# Patient Record
Sex: Male | Born: 1970
Health system: Southern US, Community
[De-identification: ages and names within clinical notes are randomized; demographics above are authoritative.]

## PROBLEM LIST (undated history)

## (undated) DIAGNOSIS — G4730 Sleep apnea, unspecified: Secondary | ICD-10-CM

## (undated) DIAGNOSIS — R569 Unspecified convulsions: Secondary | ICD-10-CM

## (undated) DIAGNOSIS — E119 Type 2 diabetes mellitus without complications: Secondary | ICD-10-CM

## (undated) DIAGNOSIS — I10 Essential (primary) hypertension: Secondary | ICD-10-CM

## (undated) DIAGNOSIS — M549 Dorsalgia, unspecified: Secondary | ICD-10-CM

## (undated) HISTORY — DX: Unspecified convulsions: R56.9

## (undated) HISTORY — PX: OTHER SURGICAL HISTORY: SHX169

## (undated) HISTORY — DX: Dorsalgia, unspecified: M54.9

## (undated) HISTORY — PX: CIRCUMCISION: SUR203

## (undated) HISTORY — DX: Essential (primary) hypertension: I10

## (undated) HISTORY — DX: Type 2 diabetes mellitus without complications: E11.9

## (undated) HISTORY — DX: Sleep apnea, unspecified: G47.30

---

## 1998-11-28 ENCOUNTER — Encounter: Admission: RE | Admit: 1998-11-28 | Discharge: 1998-11-28 | Payer: Self-pay | Admitting: *Deleted

## 2003-09-07 ENCOUNTER — Encounter: Admission: RE | Admit: 2003-09-07 | Discharge: 2003-09-07 | Payer: Self-pay | Admitting: Occupational Medicine

## 2005-01-09 ENCOUNTER — Encounter: Admission: RE | Admit: 2005-01-09 | Discharge: 2005-04-09 | Payer: Self-pay | Admitting: Family Medicine

## 2005-03-12 ENCOUNTER — Ambulatory Visit (HOSPITAL_COMMUNITY): Admission: RE | Admit: 2005-03-12 | Discharge: 2005-03-12 | Payer: Self-pay | Admitting: Gastroenterology

## 2010-12-05 ENCOUNTER — Encounter
Admission: RE | Admit: 2010-12-05 | Discharge: 2010-12-05 | Payer: Self-pay | Source: Home / Self Care | Attending: Family Medicine | Admitting: Family Medicine

## 2010-12-14 ENCOUNTER — Ambulatory Visit (HOSPITAL_COMMUNITY)
Admission: RE | Admit: 2010-12-14 | Discharge: 2010-12-14 | Disposition: A | Discharge status: Home / Self Care | Payer: Managed Care, Other (non HMO) | Source: Ambulatory Visit | Attending: Urology | Admitting: Urology

## 2010-12-14 ENCOUNTER — Ambulatory Visit (HOSPITAL_COMMUNITY): Payer: Managed Care, Other (non HMO)

## 2010-12-14 DIAGNOSIS — E669 Obesity, unspecified: Secondary | ICD-10-CM | POA: Insufficient documentation

## 2010-12-14 DIAGNOSIS — E119 Type 2 diabetes mellitus without complications: Secondary | ICD-10-CM | POA: Insufficient documentation

## 2010-12-14 DIAGNOSIS — N201 Calculus of ureter: Secondary | ICD-10-CM | POA: Insufficient documentation

## 2010-12-14 LAB — GLUCOSE, CAPILLARY
Glucose-Capillary: 94 mg/dL (ref 70–99)
Glucose-Capillary: 159 mg/dL — ABNORMAL HIGH (ref 70–99)

## 2011-02-19 ENCOUNTER — Other Ambulatory Visit: Payer: Self-pay | Admitting: Urology

## 2011-02-19 ENCOUNTER — Encounter (HOSPITAL_COMMUNITY): Payer: Managed Care, Other (non HMO) | Attending: Urology

## 2011-02-19 DIAGNOSIS — N201 Calculus of ureter: Secondary | ICD-10-CM | POA: Insufficient documentation

## 2011-02-19 DIAGNOSIS — Z79899 Other long term (current) drug therapy: Secondary | ICD-10-CM | POA: Insufficient documentation

## 2011-02-19 DIAGNOSIS — E119 Type 2 diabetes mellitus without complications: Secondary | ICD-10-CM | POA: Insufficient documentation

## 2011-02-19 DIAGNOSIS — Z01812 Encounter for preprocedural laboratory examination: Secondary | ICD-10-CM | POA: Insufficient documentation

## 2011-02-19 DIAGNOSIS — Z01810 Encounter for preprocedural cardiovascular examination: Secondary | ICD-10-CM | POA: Insufficient documentation

## 2011-02-19 DIAGNOSIS — E785 Hyperlipidemia, unspecified: Secondary | ICD-10-CM | POA: Insufficient documentation

## 2011-02-19 LAB — BASIC METABOLIC PANEL
BUN: 10 mg/dL (ref 6–23)
CO2: 31 mEq/L (ref 19–32)
Calcium: 9.7 mg/dL (ref 8.4–10.5)
Chloride: 104 mEq/L (ref 96–112)
Creatinine, Ser: 0.73 mg/dL (ref 0.4–1.5)
GFR calc Af Amer: >60 mL/min (ref >60)
GFR calc non Af Amer: >60 mL/min (ref >60)
Glucose, Bld: 104 mg/dL — ABNORMAL HIGH (ref 70–99)
Potassium: 4.9 mEq/L (ref 3.5–5.1)
Sodium: 141 mEq/L (ref 135–145)

## 2011-02-19 LAB — CBC
HCT: 45.6 % (ref 39.0–52.0)
Hemoglobin: 15.5 g/dL (ref 13.0–17.0)
MCH: 27.1 pg (ref 26.0–34.0)
MCHC: 34 g/dL (ref 30.0–36.0)
MCV: 79.6 fL (ref 78.0–100.0)
Platelets: 297 10*3/uL (ref 150–400)
RBC: 5.73 MIL/uL (ref 4.22–5.81)
RDW: 13.1 % (ref 11.5–15.5)
WBC: 8.5 10*3/uL (ref 4.0–10.5)

## 2011-02-19 LAB — SURGICAL PCR SCREEN
MRSA, PCR: NEGATIVE
Staphylococcus aureus: NEGATIVE

## 2011-02-23 ENCOUNTER — Ambulatory Visit (HOSPITAL_COMMUNITY)
Admission: RE | Admit: 2011-02-23 | Discharge: 2011-02-23 | Disposition: A | Discharge status: Home / Self Care | Payer: Managed Care, Other (non HMO) | Source: Ambulatory Visit | Attending: Urology | Admitting: Urology

## 2011-02-23 DIAGNOSIS — I10 Essential (primary) hypertension: Secondary | ICD-10-CM | POA: Insufficient documentation

## 2011-02-23 DIAGNOSIS — E119 Type 2 diabetes mellitus without complications: Secondary | ICD-10-CM | POA: Insufficient documentation

## 2011-02-23 DIAGNOSIS — Z79899 Other long term (current) drug therapy: Secondary | ICD-10-CM | POA: Insufficient documentation

## 2011-02-23 DIAGNOSIS — E785 Hyperlipidemia, unspecified: Secondary | ICD-10-CM | POA: Insufficient documentation

## 2011-02-23 DIAGNOSIS — N201 Calculus of ureter: Secondary | ICD-10-CM | POA: Insufficient documentation

## 2011-02-23 LAB — GLUCOSE, CAPILLARY: Glucose-Capillary: 106 mg/dL — ABNORMAL HIGH (ref 70–99)

## 2011-02-26 LAB — GLUCOSE, CAPILLARY: Glucose-Capillary: 89 mg/dL (ref 70–99)

## 2011-03-05 NOTE — Op Note (Signed)
[name], [name]                 ACCOUNT NO.:  [id number]  MEDICAL RECORD NO.:  [id number]           PATIENT TYPE:  O  LOCATION:  DAYL                         FACILITY:  [facility]  PHYSICIAN:  Heloise Purpura, MD      DATE OF BIRTH:  July 14, 1971  DATE OF PROCEDURE:  02/23/2011 DATE OF DISCHARGE:  02/23/2011                              OPERATIVE REPORT   PREOPERATIVE DIAGNOSIS:  Right ureteral calculus.  POSTOPERATIVE DIAGNOSIS:  Right ureteral calculus.  PROCEDURES: 1. Cystoscopy. 2. Right retrograde pyelography. 3. Right ureteroscopic laser lithotripsy and stone removal. 4. Right ureteral stent placement (6 x 24).  SURGEON:  Heloise Purpura, MD  ANESTHESIA:  General.  COMPLICATIONS:  None.  ESTIMATED BLOOD LOSS:  Minimal.  INDICATIONS:  [name] is a 40 year old gentleman with a 4 mm to 5 mm proximal right ureteral calculus.  He had undergone a shock wave lithotripsy but had a persistent stone and was unable to pass the stone with conservative measures.  We therefore discussed options and I recommended proceeding with ureteroscopic laser lithotripsy.  The potential risks and complications associated with the above procedures were discussed in detail and informed consent was obtained.  DESCRIPTION OF PROCEDURE:  The patient was taken to the operating room and a general anesthetic was administered.  He was given preoperative antibiotics, placed in the dorsal lithotomy position, and prepped and draped in the usual sterile fashion.  Next, a preoperative time-out was performed.  Cystourethroscopy was then performed which revealed a normal anterior and posterior urethra.  Inspection of the bladder revealed no evidence of any bladder tumors, stones, or other mucosal pathology.  The ureteral orifices were in their expected anatomic position.  The right ureteral orifice was then identified and intubated with a 6-French ureteral catheter.  Omnipaque contrast was injected with a  filling defect seen in the proximal ureter consistent with the patient's known stone.  A 0.038 sensor guidewire was then advanced up into the right renal pelvis under fluoroscopic guidance.  A 12/14 ACMI Gyrus ureteral access sheath was then advanced over the wire up into the proximal ureter well below the level of the stone.  Of note, the entire distal ureter appeared normal on retrograde pyelography without other filling defects or abnormalities.  The digital flexible ureteroscope was then advanced through the ureteral access sheath and the proximal ureter was examined.  No calculus was seen in the proximal ureter.  The patient's stone was eventually found up in the renal pelvis and had migrated proximally.  He was able to be grasped with a 0 tip nitinol basket brought into the proximal ureter.  Backstop gel was then injected above the level of the stone and laser lithotripsy was performed with a 200- micron holmium laser fiber at a setting of 0.6 joules and 6 Hz.  The stone was fragmented into 2 pieces and each of these fragments was then removed with a 0 tip nitinol basket.  The remainder of the renal pelvis was then examined and no remaining stone fragments or other calculi were identified.  A 0.038 sensor guidewire was then advanced through the ureteroscope and  the ureteroscope and ureteral access sheath were withdrawn together with the remainder of the ureter examined under direct vision and no further abnormalities or stones identified.  The wire was then back loaded on the rigid cystoscope and a 6 x 24 double-J ureteral stent was advanced over the wire using Seldinger technique.  It was positioned appropriately under fluoroscopic and cystoscopic guidance.  The wire was then removed with good curl noted in the renal pelvis as well as in the bladder.  The patient tolerated the procedure well without complications.  He was able to be awakened and transferred to recovery unit in  satisfactory condition.     Heloise Purpura, MD     LB/MEDQ  D:  02/23/2011  T:  02/23/2011  Job:  [id number]  Electronically Signed by Heloise Purpura MD on 03/05/2011 05:29:26 PM

## 2012-01-19 IMAGING — CR DG ABDOMEN 1V
2 series · 2 of 2 positions shown · non-contrast
Comparison: CT abdomen and pelvis 12/05/2010.

CLINICAL DATA: Patient for lithotripsy.

ABDOMEN - 1 VIEW

[t abdomen supine *]
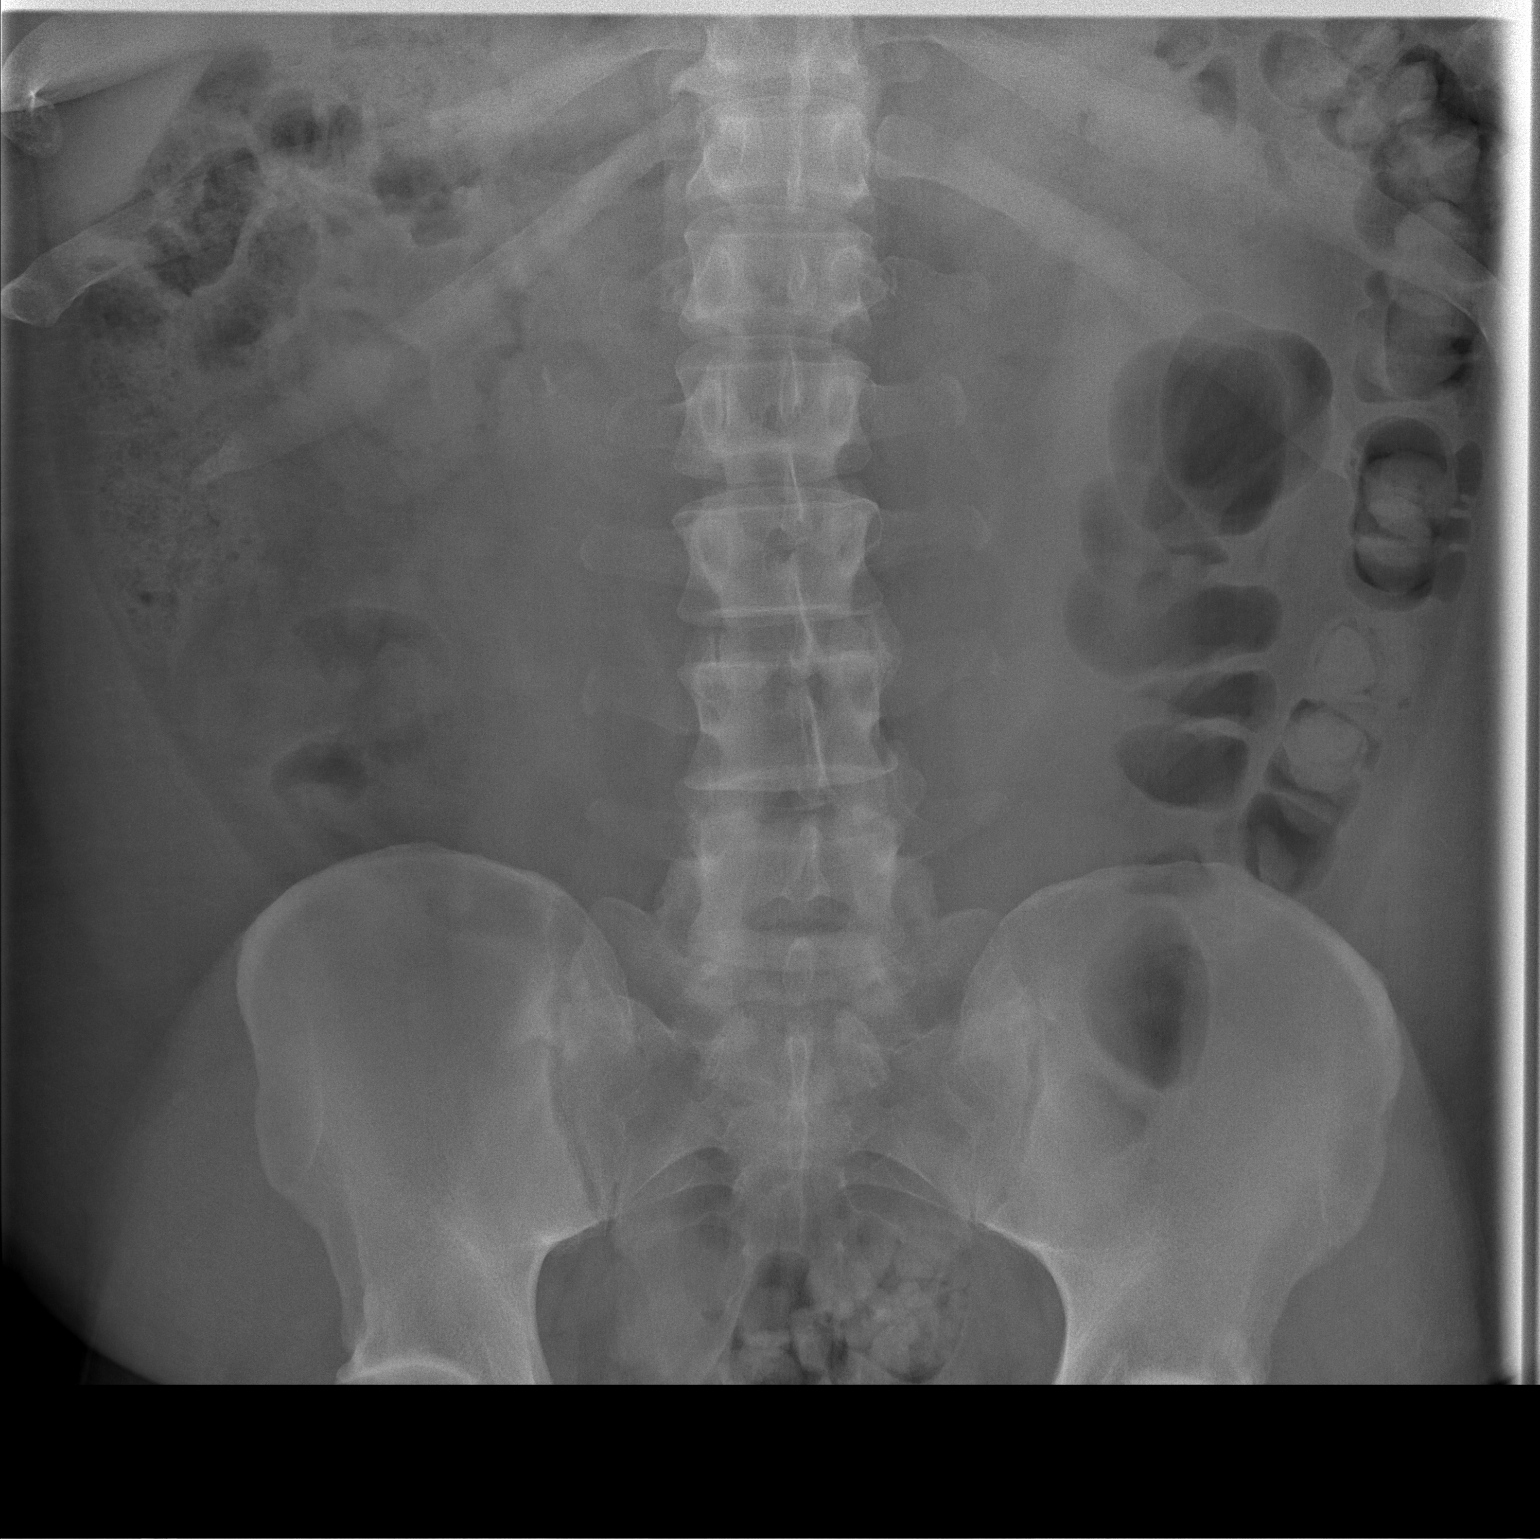

[t abdomen supine]
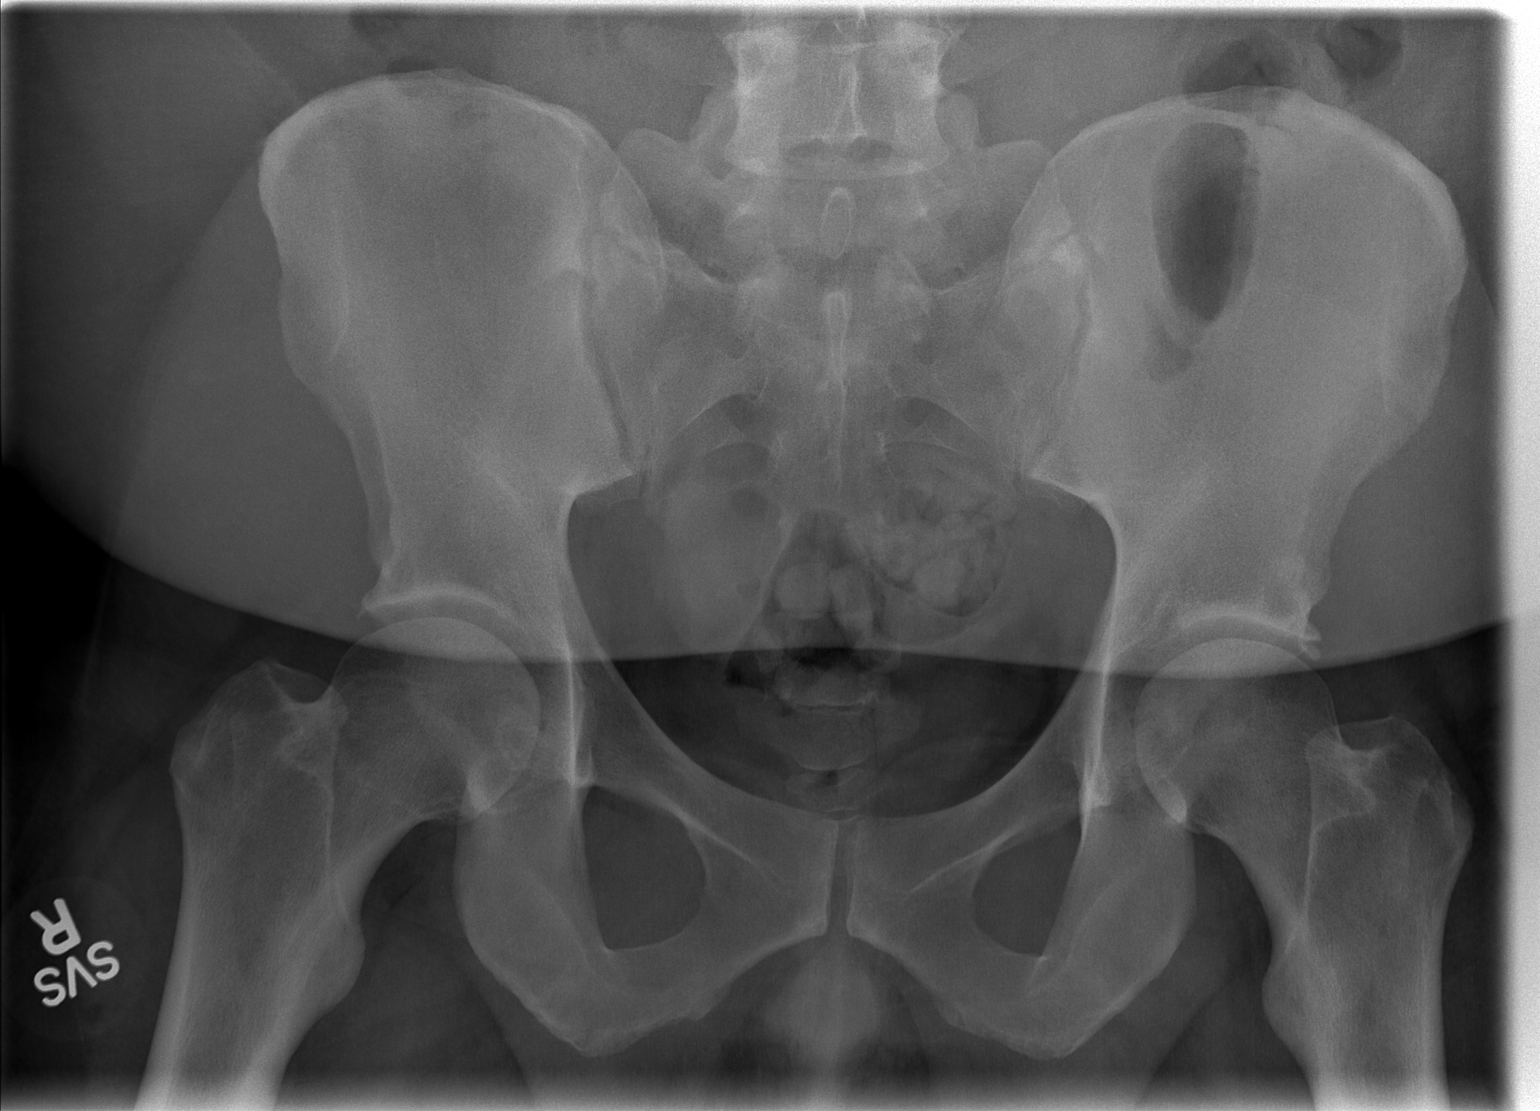

[2 of 2 positions shown; findings below may reference images not displayed]

FINDINGS: A 1.0 cm calcification is seen in expected course the
right ureter just above the level of the L2 transverse process.  No
other unexpected calcifications.  Bowel gas pattern normal.
IMPRESSION: Proximal right ureteral calculus.

## 2013-07-01 ENCOUNTER — Ambulatory Visit (INDEPENDENT_AMBULATORY_CARE_PROVIDER_SITE_OTHER): Payer: BC Managed Care – PPO | Admitting: General Surgery

## 2013-07-01 ENCOUNTER — Encounter (INDEPENDENT_AMBULATORY_CARE_PROVIDER_SITE_OTHER): Payer: Self-pay | Admitting: General Surgery

## 2013-07-01 DIAGNOSIS — I10 Essential (primary) hypertension: Secondary | ICD-10-CM

## 2013-07-01 DIAGNOSIS — E119 Type 2 diabetes mellitus without complications: Secondary | ICD-10-CM

## 2013-07-01 NOTE — Progress Notes (Signed)
Subjective:   Morbid obesity  Patient ID: [name], male   DOB: Oct 13, 1971, 42 y.o.   MRN: [id number]  HPI [name] y.o.male Referred by Dr. Clarene Duke for consideration for surgical treatment for morbid obesity.  he  gives a history of progressive obesity since childhood despite multiple attempts at medical management. He has been able to lose a large amount of weight on a number of occasions but then experiences inevitable weight regain. his weight has been affecting him in a number of ways including the onset of diabetes mellitus and hypertension. He also has significant knee and back pain.  He is very concerned about his long-term health lifespan going forward if he is not able to get his weight down..   he has been to our initial information seminar, researched surgical options thoroughly and is interested in Gastric bypass redo to the specific effect on diabetes.  Past Medical History  Diagnosis Date  . Hypertension   . Diabetes mellitus without complication   . Seizures    Past Surgical History  Procedure Laterality Date  . Kidney stone removal    . Circumcision     Current Outpatient Prescriptions  Medication Sig Dispense Refill  . atorvastatin (LIPITOR) 80 MG tablet       . DILANTIN 100 MG ER capsule       . Levetiracetam 750 MG TB24       . losartan (COZAAR) 25 MG tablet       . metFORMIN (GLUCOPHAGE) 1000 MG tablet       . nabumetone (RELAFEN) 500 MG tablet       . niacin (NIASPAN) 500 MG CR tablet       . pioglitazone (ACTOS) 45 MG tablet        No current facility-administered medications for this visit.   Allergies  Allergen Reactions  . Tramadol     seizures   History  Substance Use Topics  . Smoking status: Never Smoker   . Smokeless tobacco: Not on file  . Alcohol Use: No  He is married. His wife is a Engineer, civil (consulting) who works for [employer] and supports his decision to seek surgery    Review of Systems  Constitutional: Negative.   HENT: Negative.   Eyes:  Negative.   Respiratory: Negative.   Cardiovascular: Negative.   Gastrointestinal: Negative.   Genitourinary: Negative.   Musculoskeletal: Positive for back pain and arthralgias. Negative for joint swelling and gait problem.  Neurological: Positive for seizures. Negative for syncope, weakness and numbness.  Psychiatric/Behavioral: Negative.        Objective:   Physical Exam BP 122/68  Pulse 84  Temp(Src) 96.6 F (35.9 C) (Temporal)  Resp 16  Ht 5' 10.25" (1.784 m)  Wt 289 lb 3.2 oz (131.18 kg)  BMI 41.22 kg/m2  SpO2 97% General: Alert, Morbidly obese African American male, in no distress Skin: Warm and dry without rash or infection. HEENT: No palpable masses or thyromegaly. Sclera nonicteric. Pupils equal round and reactive. Oropharynx clear. Lymph nodes: No cervical, supraclavicular, or inguinal nodes palpable. Lungs: Breath sounds clear and equal without increased work of breathing Cardiovascular: Regular rate and rhythm without murmur. No JVD or edema. Peripheral pulses intact. Abdomen: Nondistended. Soft and nontender. No masses palpable. No organomegaly. No palpable hernias. Extremities: No edema or joint swelling or deformity. No chronic venous stasis changes. Neurologic: Alert and fully oriented. Gait normal.    Assessment:     Patient with progressive morbid obesity unresponsive to  multiple efforts at medical management who presents with a BMI of 41 and comorbidities of Adult-onset diabetes mellitus, hypertension and joint pain/arthritis.. I believe there would be very significant medical benefit from surgical weight loss. After our discussion of surgical options currently available the patient has decided to proceed with laparoscopic Roux-en-Y gastric bypass due to the reasons above. We have discussed the nature of the problem and the risks of remaining morbidly obese. We discussed laparoscopic Roux-en-Y gastric bypass in detail including the nature of the procedure,  expected hospitalization and recovery, and major risks of anesthetic complications, bleeding, blood clots and pulmonary emboli, leakage and infection and long-term risks of stricture, ulceration, bowel obstruction, nutritional deficiencies, and failure to lose weight or weight regain. We discussed these problems could lead to death. The patient was given a complete consent form to review and all questions were answered. We will go ahead with preoperative including psychological and nutrition evaluations, H. pylori testing, ultrasound, bone density, and routine lab and x-rays. I will see the patient back following these studies.    Plan:     As above

## 2013-07-01 NOTE — Addendum Note (Signed)
Addended by: Maryan Puls on: 07/01/2013 05:05 PM   Modules accepted: Orders

## 2013-07-07 ENCOUNTER — Ambulatory Visit (HOSPITAL_COMMUNITY)
Admission: RE | Admit: 2013-07-07 | Discharge: 2013-07-07 | Disposition: A | Discharge status: Home / Self Care | Payer: BC Managed Care – PPO | Source: Ambulatory Visit | Attending: General Surgery | Admitting: General Surgery

## 2013-07-07 DIAGNOSIS — Z6841 Body Mass Index (BMI) 40.0 and over, adult: Secondary | ICD-10-CM | POA: Insufficient documentation

## 2013-07-07 DIAGNOSIS — I10 Essential (primary) hypertension: Secondary | ICD-10-CM

## 2013-07-07 DIAGNOSIS — Q619 Cystic kidney disease, unspecified: Secondary | ICD-10-CM | POA: Insufficient documentation

## 2013-07-07 DIAGNOSIS — E119 Type 2 diabetes mellitus without complications: Secondary | ICD-10-CM

## 2013-07-07 DIAGNOSIS — Z13820 Encounter for screening for osteoporosis: Secondary | ICD-10-CM | POA: Insufficient documentation

## 2013-07-07 DIAGNOSIS — M129 Arthropathy, unspecified: Secondary | ICD-10-CM | POA: Insufficient documentation

## 2013-07-07 DIAGNOSIS — I517 Cardiomegaly: Secondary | ICD-10-CM | POA: Insufficient documentation

## 2013-07-07 LAB — CBC WITH DIFFERENTIAL/PLATELET
Basophils Absolute: 0 10*3/uL (ref 0.0–0.1)
Basophils Relative: 1 % (ref 0–1)
Eosinophils Absolute: 0.2 10*3/uL (ref 0.0–0.7)
Eosinophils Relative: 3 % (ref 0–5)
HCT: 44 % (ref 39.0–52.0)
Hemoglobin: 15 g/dL (ref 13.0–17.0)
Lymphocytes Relative: 42 % (ref 12–46)
Lymphs Abs: 2.4 10*3/uL (ref 0.7–4.0)
MCH: 28 pg (ref 26.0–34.0)
MCHC: 34.1 g/dL (ref 30.0–36.0)
MCV: 82.2 fL (ref 78.0–100.0)
Monocytes Absolute: 0.4 10*3/uL (ref 0.1–1.0)
Monocytes Relative: 7 % (ref 3–12)
Neutro Abs: 2.6 10*3/uL (ref 1.7–7.7)
Neutrophils Relative %: 47 % (ref 43–77)
Platelets: 236 10*3/uL (ref 150–400)
RBC: 5.35 MIL/uL (ref 4.22–5.81)
RDW: 13.9 % (ref 11.5–15.5)
WBC: 5.7 10*3/uL (ref 4.0–10.5)

## 2013-07-07 LAB — HEMOGLOBIN A1C
Hgb A1c MFr Bld: 5.3 % (ref <5.7)
Mean Plasma Glucose: 105 mg/dL (ref <117)

## 2013-07-08 ENCOUNTER — Ambulatory Visit (HOSPITAL_COMMUNITY): Payer: BC Managed Care – PPO

## 2013-07-08 ENCOUNTER — Encounter (HOSPITAL_COMMUNITY)
Admission: RE | Disposition: A | Discharge status: Home / Self Care | Payer: Self-pay | Source: Ambulatory Visit | Attending: General Surgery

## 2013-07-08 ENCOUNTER — Ambulatory Visit (HOSPITAL_COMMUNITY)
Admission: RE | Admit: 2013-07-08 | Discharge: 2013-07-08 | Disposition: A | Discharge status: Home / Self Care | Payer: BC Managed Care – PPO | Source: Ambulatory Visit | Attending: General Surgery | Admitting: General Surgery

## 2013-07-08 HISTORY — PX: BREATH TEK H PYLORI: SHX5422

## 2013-07-08 LAB — COMPREHENSIVE METABOLIC PANEL
ALT: 49 U/L (ref 0–53)
AST: 24 U/L (ref 0–37)
Albumin: 4.1 g/dL (ref 3.5–5.2)
Alkaline Phosphatase: 64 U/L (ref 39–117)
BUN: 13 mg/dL (ref 6–23)
CO2: 32 mEq/L (ref 19–32)
Calcium: 9.2 mg/dL (ref 8.4–10.5)
Chloride: 103 mEq/L (ref 96–112)
Creat: 0.87 mg/dL (ref 0.50–1.35)
Glucose, Bld: 90 mg/dL (ref 70–99)
Potassium: 4.4 mEq/L (ref 3.5–5.3)
Sodium: 141 mEq/L (ref 135–145)
Total Bilirubin: 0.4 mg/dL (ref 0.3–1.2)
Total Protein: 6.7 g/dL (ref 6.0–8.3)

## 2013-07-08 LAB — LIPID PANEL
Cholesterol: 128 mg/dL (ref 0–200)
HDL: 51 mg/dL (ref >39)
LDL Cholesterol: 67 mg/dL (ref 0–99)
Total CHOL/HDL Ratio: 2.5 Ratio
Triglycerides: 49 mg/dL (ref <150)
VLDL: 10 mg/dL (ref 0–40)

## 2013-07-08 LAB — TSH: TSH: 5.201 u[IU]/mL — ABNORMAL HIGH (ref 0.350–4.500)

## 2013-07-08 LAB — T4: T4, Total: 6 ug/dL (ref 5.0–12.5)

## 2013-07-08 SURGERY — BREATH TEST, FOR HELICOBACTER PYLORI

## 2013-07-09 ENCOUNTER — Encounter (HOSPITAL_COMMUNITY): Payer: Self-pay | Admitting: General Surgery

## 2013-07-23 ENCOUNTER — Encounter: Payer: Self-pay | Admitting: Dietician

## 2013-07-23 ENCOUNTER — Encounter: Payer: BC Managed Care – PPO | Attending: General Surgery | Admitting: Dietician

## 2013-07-23 DIAGNOSIS — Z713 Dietary counseling and surveillance: Secondary | ICD-10-CM | POA: Insufficient documentation

## 2013-07-23 NOTE — Progress Notes (Signed)
  Pre-Op Assessment Visit:  Pre-Operative RYGB Surgery  Medical Nutrition Therapy:  Appt start time: 1000   End time:  1100.  Patient was seen on 07/23/2013 for Pre-Operative RYGB Nutrition Assessment. Assessment and letter of approval faxed to [facility] Bariatric Surgery Program coordinator on 07/23/2013.   Handouts given during visit include:  Pre-Op Goals Bariatric Surgery Protein Shakes  Patient to call the [facility] to enroll in Pre-Op and Post-Op Nutrition Education when surgery date is scheduled.

## 2013-07-23 NOTE — Patient Instructions (Addendum)
Patient to call the [facility] to enroll in Pre-Op and Post-Op Nutrition Education when surgery date is scheduled. 

## 2014-03-01 ENCOUNTER — Other Ambulatory Visit: Payer: Self-pay | Admitting: Family Medicine

## 2014-03-01 DIAGNOSIS — M545 Low back pain, unspecified: Secondary | ICD-10-CM

## 2014-03-04 ENCOUNTER — Ambulatory Visit
Admission: RE | Admit: 2014-03-04 | Discharge: 2014-03-04 | Disposition: A | Discharge status: Home / Self Care | Payer: 59 | Source: Ambulatory Visit | Attending: Family Medicine | Admitting: Family Medicine

## 2014-03-04 DIAGNOSIS — M545 Low back pain, unspecified: Secondary | ICD-10-CM

## 2014-08-12 IMAGING — US US ABDOMEN COMPLETE
1 series · 13 of 25 positions shown · non-contrast
Comparison: CT abdomen pelvis - 12/05/2010

CLINICAL DATA: Preoperative examination (weight loss surgery)

COMPLETE ABDOMINAL ULTRASOUND

[Series 1: us abdomen complete · 13 of 74 slices shown]
[im 1/74]
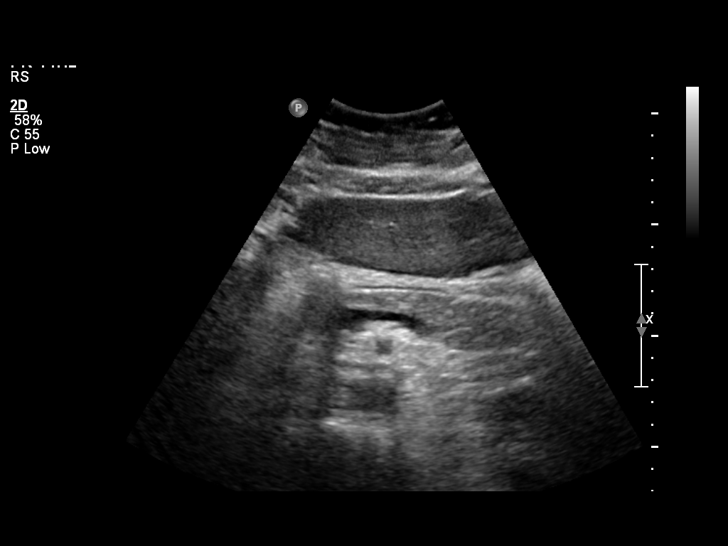
[im 7/74]
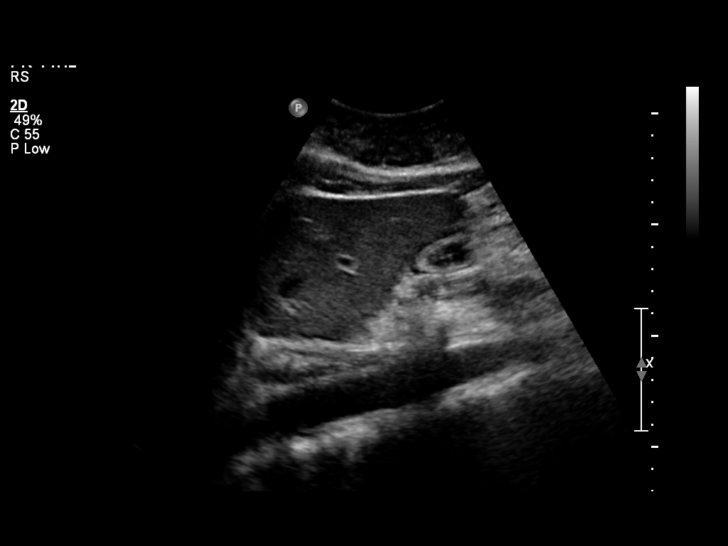
[im 13/74]
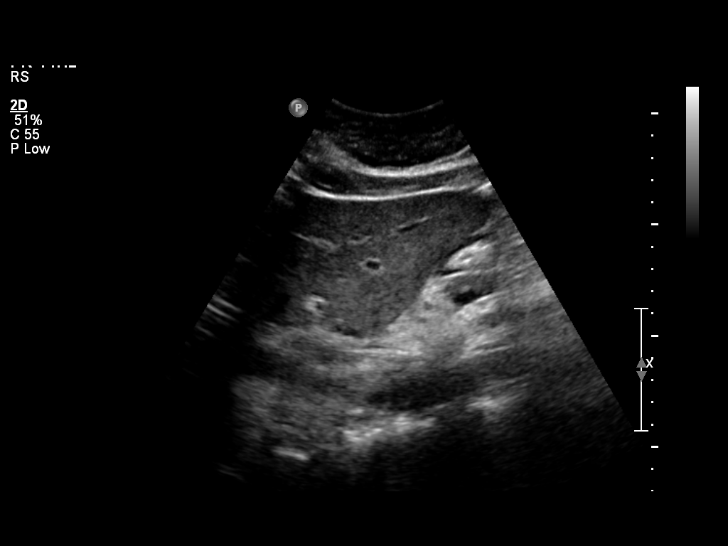
[im 19/74]
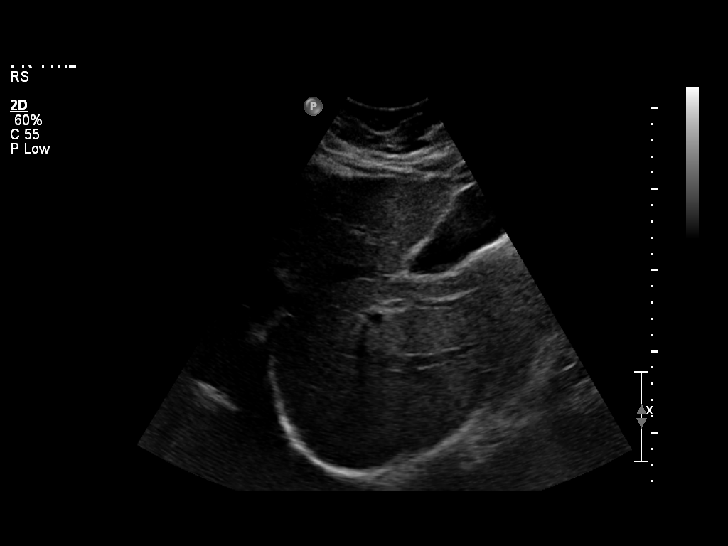
[im 25/74]
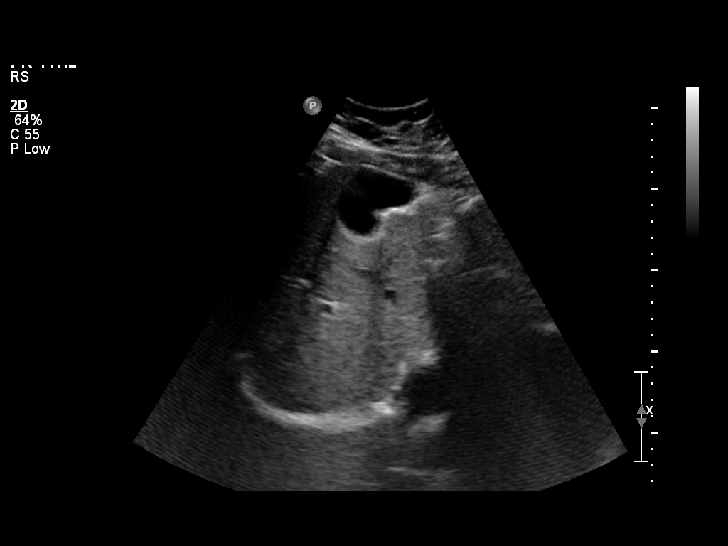
[im 31/74]
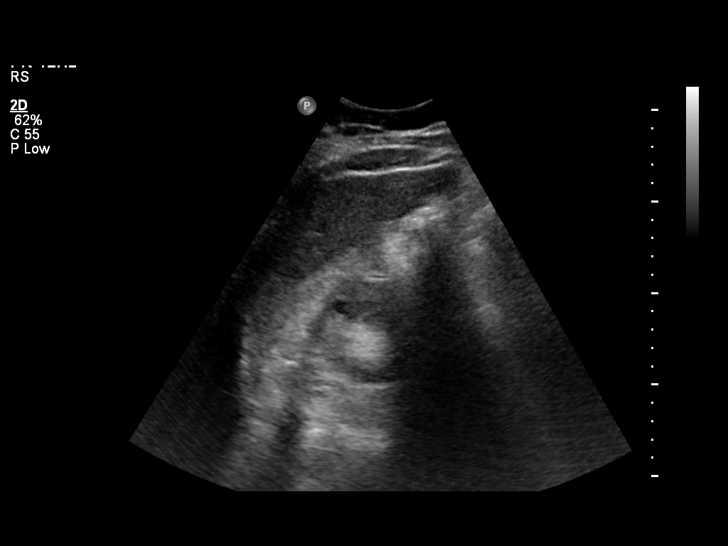
[im 37/74]
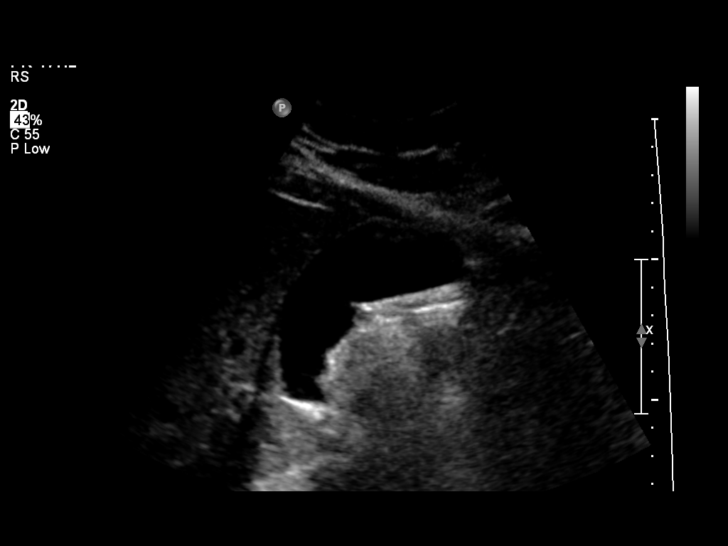
[im 43/74]
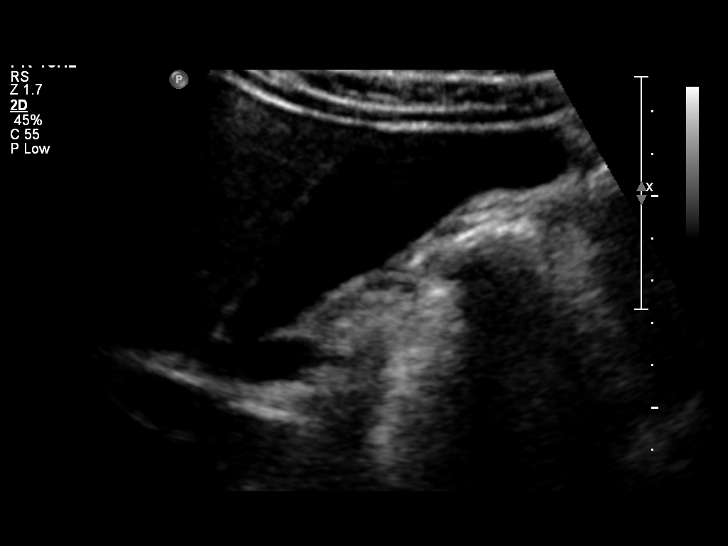
[im 49/74]
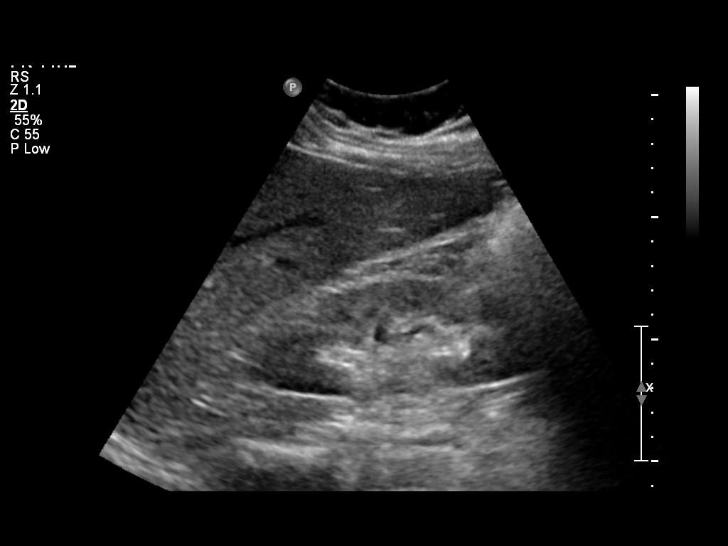
[im 55/74]
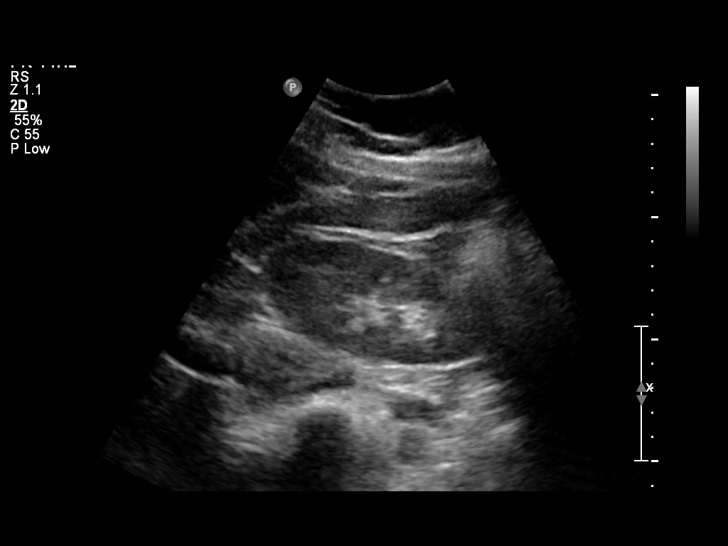
[im 61/74]
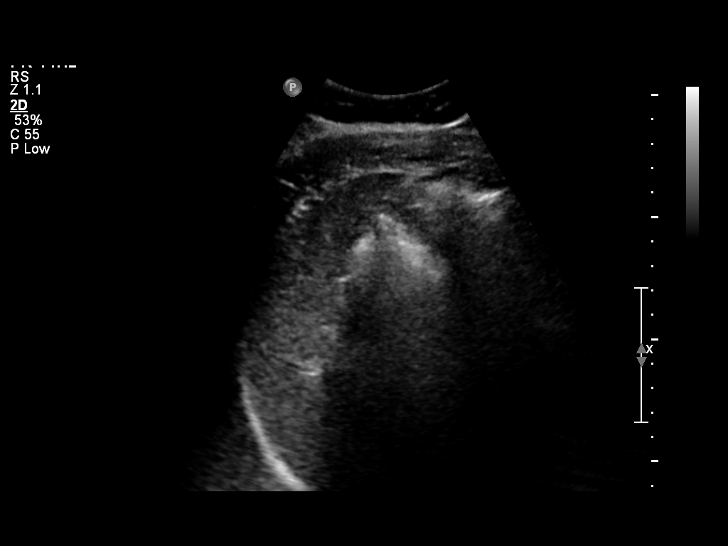
[im 67/74]
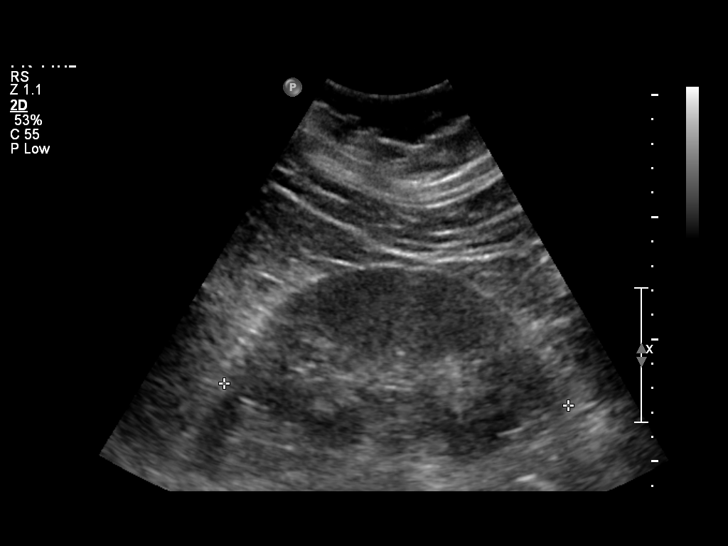
[im 74/74]
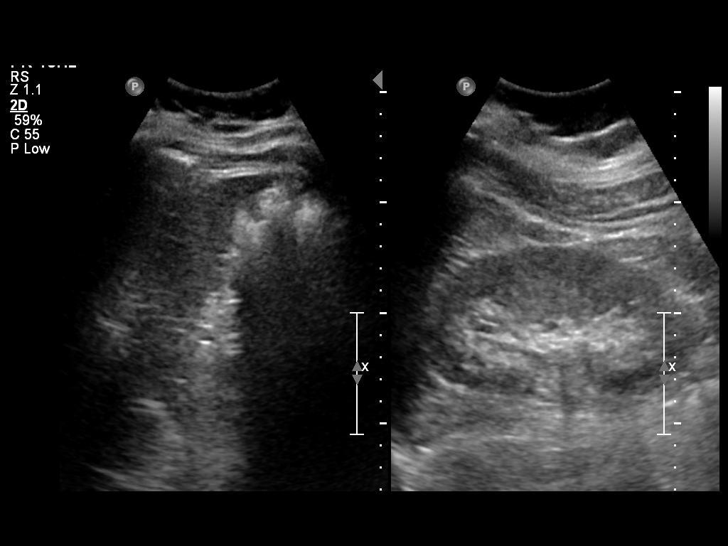

[13 of 25 positions shown; findings below may reference images not displayed]

FINDINGS: Gallbladder:  Sonographically normal.  No echogenic gallstones or
gall sludge.  No gallbladder wall thickening or pericholecystic
fluid.  Negative sonographic Murphy's sign.

Common bile duct:  Normal in size measuring 4 mm in diameter

Liver:  Homogeneous hepatic echotexture.  No discrete hepatic
lesions.  No definite evidence of intrahepatic biliary ductal
dilatation.  No ascites.

IVC:  Appears normal.

Pancreas:  Limited visualization of the pancreatic head and neck is
normal.  Visualization of the pancreatic body and tail is obscured
by bowel gas.

Spleen:  Normal in size measuring 5.5 cm in length

Right Kidney:  Normal cortical thickness, echogenicity and size,
measuring 13.6 cm in length.  Note is made of an approximately
x 1.2 x 1.1 cm anechoic cortical cyst within the superior pole of
the right kidney (images 58 and 59).  No echogenic renal stones.
No urinary obstruction.

Left Kidney:  Normal cortical thickness, echogenicity and size,
measuring 13.0 cm in length.  No focal renal lesions.  No echogenic
renal stones.  No urinary obstruction.

Abdominal aorta:  No aneurysm identified.
IMPRESSION: 1.  Negative abdominal ultrasound.

2.  Incidental note made of an approximately 1.4 cm right-sided
renal cyst.

## 2016-04-13 MED FILL — DILANTIN 100 MG CAPSULE: 100 | 30 days supply | Qty: 150 | Fill #0

## 2016-04-19 DIAGNOSIS — E118 Type 2 diabetes mellitus with unspecified complications: Secondary | ICD-10-CM | POA: Diagnosis not present

## 2016-04-19 DIAGNOSIS — E782 Mixed hyperlipidemia: Secondary | ICD-10-CM | POA: Diagnosis not present

## 2016-04-19 DIAGNOSIS — R809 Proteinuria, unspecified: Secondary | ICD-10-CM | POA: Diagnosis not present

## 2016-04-19 DIAGNOSIS — Z79899 Other long term (current) drug therapy: Secondary | ICD-10-CM | POA: Diagnosis not present

## 2016-04-19 DIAGNOSIS — Z7984 Long term (current) use of oral hypoglycemic drugs: Secondary | ICD-10-CM | POA: Diagnosis not present

## 2016-04-19 DIAGNOSIS — G40909 Epilepsy, unspecified, not intractable, without status epilepticus: Secondary | ICD-10-CM | POA: Diagnosis not present

## 2016-04-19 DIAGNOSIS — Z125 Encounter for screening for malignant neoplasm of prostate: Secondary | ICD-10-CM | POA: Diagnosis not present

## 2016-05-01 MED FILL — LEVETIRACETAM ER 750 MG TAB: 750 | 90 days supply | Qty: 360 | Fill #0 | Status: TO

## 2016-05-04 DIAGNOSIS — G40909 Epilepsy, unspecified, not intractable, without status epilepticus: Secondary | ICD-10-CM | POA: Diagnosis not present

## 2016-05-04 DIAGNOSIS — Z125 Encounter for screening for malignant neoplasm of prostate: Secondary | ICD-10-CM | POA: Diagnosis not present

## 2016-05-04 DIAGNOSIS — Z7984 Long term (current) use of oral hypoglycemic drugs: Secondary | ICD-10-CM | POA: Diagnosis not present

## 2016-05-04 DIAGNOSIS — E782 Mixed hyperlipidemia: Secondary | ICD-10-CM | POA: Diagnosis not present

## 2016-05-04 DIAGNOSIS — Z79899 Other long term (current) drug therapy: Secondary | ICD-10-CM | POA: Diagnosis not present

## 2016-05-04 DIAGNOSIS — R809 Proteinuria, unspecified: Secondary | ICD-10-CM | POA: Diagnosis not present

## 2016-05-04 DIAGNOSIS — E118 Type 2 diabetes mellitus with unspecified complications: Secondary | ICD-10-CM | POA: Diagnosis not present

## 2016-05-04 MED FILL — LOSARTAN POTASSIUM 25 MG TA: 25 | 90 days supply | Qty: 90 | Fill #0

## 2016-05-04 MED FILL — metFORMIN HCL 1000 MG TABS: 1000 | 90 days supply | Qty: 180 | Fill #0

## 2016-05-04 MED FILL — ATORVASTATIN 80 MG TABLET: 80 | 90 days supply | Qty: 90 | Fill #0

## 2016-05-10 DIAGNOSIS — E118 Type 2 diabetes mellitus with unspecified complications: Secondary | ICD-10-CM | POA: Diagnosis not present

## 2016-05-10 DIAGNOSIS — I10 Essential (primary) hypertension: Secondary | ICD-10-CM | POA: Diagnosis not present

## 2016-05-10 DIAGNOSIS — E782 Mixed hyperlipidemia: Secondary | ICD-10-CM | POA: Diagnosis not present

## 2016-05-10 DIAGNOSIS — E669 Obesity, unspecified: Secondary | ICD-10-CM | POA: Diagnosis not present

## 2016-05-10 DIAGNOSIS — Z6839 Body mass index (BMI) 39.0-39.9, adult: Secondary | ICD-10-CM | POA: Diagnosis not present

## 2016-05-10 DIAGNOSIS — G40909 Epilepsy, unspecified, not intractable, without status epilepticus: Secondary | ICD-10-CM | POA: Diagnosis not present

## 2016-05-10 DIAGNOSIS — Z7984 Long term (current) use of oral hypoglycemic drugs: Secondary | ICD-10-CM | POA: Diagnosis not present

## 2016-05-10 DIAGNOSIS — R0681 Apnea, not elsewhere classified: Secondary | ICD-10-CM | POA: Diagnosis not present

## 2016-05-10 MED FILL — DILANTIN 100 MG CAPSULE: 100 | 30 days supply | Qty: 150 | Fill #1

## 2016-06-11 MED FILL — DILANTIN 100 MG CAPSULE: 100 | 30 days supply | Qty: 150 | Fill #2

## 2016-07-13 MED FILL — DILANTIN 100 MG CAPSULE: 100 | 30 days supply | Qty: 150 | Fill #3

## 2016-07-23 DIAGNOSIS — R0681 Apnea, not elsewhere classified: Secondary | ICD-10-CM | POA: Diagnosis not present

## 2016-07-24 MED FILL — LOSARTAN POTASSIUM 25 MG TA: 25 | 90 days supply | Qty: 90 | Fill #1

## 2016-07-24 MED FILL — ATORVASTATIN 80 MG TABLET: 80 | 90 days supply | Qty: 90 | Fill #0

## 2016-07-24 MED FILL — metFORMIN HCL 1000 MG TABS: 1000 | 90 days supply | Qty: 180 | Fill #0

## 2016-07-26 DIAGNOSIS — S4350XA Sprain of unspecified acromioclavicular joint, initial encounter: Secondary | ICD-10-CM | POA: Diagnosis not present

## 2016-07-26 DIAGNOSIS — M7581 Other shoulder lesions, right shoulder: Secondary | ICD-10-CM | POA: Diagnosis not present

## 2016-07-26 DIAGNOSIS — W108XXA Fall (on) (from) other stairs and steps, initial encounter: Secondary | ICD-10-CM | POA: Diagnosis not present

## 2016-07-26 DIAGNOSIS — M7701 Medial epicondylitis, right elbow: Secondary | ICD-10-CM | POA: Diagnosis not present

## 2016-07-26 DIAGNOSIS — M25511 Pain in right shoulder: Secondary | ICD-10-CM | POA: Diagnosis not present

## 2016-08-02 ENCOUNTER — Other Ambulatory Visit (HOSPITAL_BASED_OUTPATIENT_CLINIC_OR_DEPARTMENT_OTHER): Payer: Self-pay

## 2016-08-02 DIAGNOSIS — R0683 Snoring: Secondary | ICD-10-CM

## 2016-08-02 DIAGNOSIS — R5383 Other fatigue: Secondary | ICD-10-CM

## 2016-08-02 DIAGNOSIS — G4733 Obstructive sleep apnea (adult) (pediatric): Secondary | ICD-10-CM

## 2016-08-02 DIAGNOSIS — G4710 Hypersomnia, unspecified: Secondary | ICD-10-CM

## 2016-08-13 MED FILL — DILANTIN 100 MG CAPSULE: 100 | 30 days supply | Qty: 150 | Fill #4

## 2016-08-28 ENCOUNTER — Ambulatory Visit (HOSPITAL_BASED_OUTPATIENT_CLINIC_OR_DEPARTMENT_OTHER): Payer: 59 | Attending: Internal Medicine | Admitting: Internal Medicine

## 2016-08-28 VITALS — Ht 70.0 in | Wt 269.0 lb

## 2016-08-28 DIAGNOSIS — G4733 Obstructive sleep apnea (adult) (pediatric): Secondary | ICD-10-CM | POA: Insufficient documentation

## 2016-08-28 DIAGNOSIS — G4736 Sleep related hypoventilation in conditions classified elsewhere: Secondary | ICD-10-CM | POA: Insufficient documentation

## 2016-08-28 DIAGNOSIS — R5383 Other fatigue: Secondary | ICD-10-CM | POA: Diagnosis not present

## 2016-08-28 DIAGNOSIS — R0683 Snoring: Secondary | ICD-10-CM | POA: Diagnosis not present

## 2016-09-06 ENCOUNTER — Other Ambulatory Visit (HOSPITAL_BASED_OUTPATIENT_CLINIC_OR_DEPARTMENT_OTHER): Payer: Self-pay

## 2016-09-06 DIAGNOSIS — R0683 Snoring: Secondary | ICD-10-CM

## 2016-09-06 DIAGNOSIS — R5383 Other fatigue: Secondary | ICD-10-CM

## 2016-09-06 DIAGNOSIS — G4733 Obstructive sleep apnea (adult) (pediatric): Secondary | ICD-10-CM

## 2016-09-06 DIAGNOSIS — G4710 Hypersomnia, unspecified: Secondary | ICD-10-CM

## 2016-09-08 DIAGNOSIS — G4733 Obstructive sleep apnea (adult) (pediatric): Secondary | ICD-10-CM | POA: Diagnosis not present

## 2016-09-08 NOTE — Procedures (Signed)
  Patient Name: [name], [name] Date: 08/28/2016 Gender: Male [name].O.B: [birth year] Age (years): 45 Referring Provider: Benjaman KindlerJim Osborne Height (inches): 60 Interpreting Physician: Alexander Duhamellinton Nelani Schmelzle MD, ABSM Weight (lbs): 269 RPSGT: [name], [name] BMI: 53 MRN: [id number] Neck Size: 18.00 CLINICAL INFORMATION Sleep [name] Type: unattended HST   Indication for sleep [name]: Fatigue, OSA, Snoring (786.09), Witnessed Apneas   Epworth Sleepiness Score: 11 SLEEP [name] TECHNIQUE A multi-channel overnight portable sleep [name] was performed. The channels recorded were: nasal airflow, thoracic respiratory movement, and oxygen saturation with a pulse oximetry. Snoring was also monitored.  MEDICATIONS Patient self administered medications include: none reported during sleep [name].  SLEEP ARCHITECTURE Patient was studied for 579.1 minutes. The sleep efficiency was 99.5 % and the patient was supine for 97.6%. The arousal index was 0.0 per hour.  RESPIRATORY PARAMETERS The overall AHI was 18.1 per hour, with a central apnea index of 0.0 per hour. The oxygen nadir was 84% during sleep.  CARDIAC DATA Mean heart rate during sleep was 81.4 bpm.  IMPRESSIONS - Moderate obstructive sleep apnea occurred during this [name] (AHI = 18.1/h). - No significant central sleep apnea occurred during this [name] (CAI = 0.0/h). - Moderate oxygen desaturation was noted during this [name] (Min O2 = 84%). - Patient snored.  DIAGNOSIS - Obstructive Sleep Apnea (327.23 [G47.33 ICD-10]) - Nocturnal Hypoxemia (327.26 [G47.36 ICD-10])  RECOMMENDATIONS - CPAP titration is usually the first choice therapeutic approach, depending on clinical judgment, for scores in this range. - Positional therapy avoiding supine position during sleep. - Avoid alcohol, sedatives and other CNS depressants that may worsen sleep apnea and disrupt normal sleep architecture. - Sleep hygiene should be reviewed to assess factors that may  improve sleep quality. - Weight management and regular exercise should be initiated or continued.  [Electronically signed] 09/08/2016 11:49 AM  Alexander Duhamellinton Evangelene Vora MD, ABSM Diplomate, American Board of Sleep Medicine   NPI: [id number]  [name],[name] Diplomate, American Board of Sleep Medicine  ELECTRONICALLY SIGNED ON:  09/08/2016, 11:47 AM [facility] PH: [phone]   FX: [phone] ACCREDITED BY THE AMERICAN ACADEMY OF SLEEP MEDICINE

## 2016-09-14 MED FILL — DILANTIN 100 MG CAPSULE: 100 | 30 days supply | Qty: 150 | Fill #5

## 2016-10-15 MED FILL — ATORVASTATIN 80 MG TABLET: 80 | 90 days supply | Qty: 90 | Fill #1

## 2016-10-15 MED FILL — LOSARTAN POTASSIUM 25 MG TA: 25 | 90 days supply | Qty: 90 | Fill #2

## 2016-10-15 MED FILL — metFORMIN HCL 1000 MG TABS: 1000 | 90 days supply | Qty: 180 | Fill #1

## 2016-10-15 MED FILL — DILANTIN 100 MG CAPSULE: 100 | 30 days supply | Qty: 150 | Fill #6

## 2016-11-15 MED FILL — DILANTIN 100 MG CAPSULE: 100 | 30 days supply | Qty: 150 | Fill #7

## 2016-11-24 ENCOUNTER — Encounter (INDEPENDENT_AMBULATORY_CARE_PROVIDER_SITE_OTHER): Payer: Self-pay | Admitting: Family Medicine

## 2016-12-05 ENCOUNTER — Ambulatory Visit (INDEPENDENT_AMBULATORY_CARE_PROVIDER_SITE_OTHER): Payer: 59 | Admitting: Family Medicine

## 2016-12-05 ENCOUNTER — Other Ambulatory Visit (INDEPENDENT_AMBULATORY_CARE_PROVIDER_SITE_OTHER): Payer: Self-pay | Admitting: Family Medicine

## 2016-12-05 ENCOUNTER — Encounter (INDEPENDENT_AMBULATORY_CARE_PROVIDER_SITE_OTHER): Payer: Self-pay | Admitting: Family Medicine

## 2016-12-05 VITALS — BP 138/83 | HR 71 | Temp 98.7°F | Resp 12 | Ht 70.0 in | Wt 282.0 lb

## 2016-12-05 DIAGNOSIS — I10 Essential (primary) hypertension: Secondary | ICD-10-CM | POA: Diagnosis not present

## 2016-12-05 DIAGNOSIS — E119 Type 2 diabetes mellitus without complications: Secondary | ICD-10-CM

## 2016-12-05 DIAGNOSIS — E785 Hyperlipidemia, unspecified: Secondary | ICD-10-CM

## 2016-12-05 DIAGNOSIS — R0602 Shortness of breath: Secondary | ICD-10-CM | POA: Diagnosis not present

## 2016-12-05 DIAGNOSIS — Z9189 Other specified personal risk factors, not elsewhere classified: Secondary | ICD-10-CM

## 2016-12-05 DIAGNOSIS — Z1389 Encounter for screening for other disorder: Secondary | ICD-10-CM

## 2016-12-05 DIAGNOSIS — R5383 Other fatigue: Secondary | ICD-10-CM | POA: Diagnosis not present

## 2016-12-05 DIAGNOSIS — Z0289 Encounter for other administrative examinations: Secondary | ICD-10-CM

## 2016-12-05 DIAGNOSIS — Z1331 Encounter for screening for depression: Secondary | ICD-10-CM

## 2016-12-05 NOTE — Progress Notes (Signed)
Office: [phone]  /  Fax: [phone]   HPI:   Chief Complaint: OBESITY  [name] (MR# [id number]) is a 46 y.o. male who presents on 12/05/2016 for obesity evaluation and treatment. Current BMI is Body mass index is 40.46 kg/m.Marland Kitchen [name] has struggled with obesity for years and has been unsuccessful in either losing weight or maintaining long term weight loss. [name] attended our information session and states he is currently in the action stage of change and ready to dedicate time achieving and maintaining a healthier weight.  [name] states his desired weight loss is 57 lbs. he has been heavy most of  his life he started gaining weight at the age of 46 yrs old.  his heaviest weight ever was 320 lbs. he is frequently drinking liquids with calories he frequently makes poor food choices he frequently eats larger portions than normal  he struggles with emotional eating    Fatigue [name] feels his energy is lower than it should be. This has worsened with weight gain and has worsened recently. [name] admits to daytime somnolence and  admits to waking up still tired. Patient is at risk for obstructive sleep apnea. Patent has a history of symptoms of morning fatigue and morning headache. Patient generally gets 4 or 5 hours of sleep per night, and states they generally have nightime awakenings. Snoring is present. Apneic episodes is present. Epworth Sleepiness Score is 5.  Dyspnea on exertion [name] notes increasing shortness of breath with exercising and seems to be worsening over time with weight gain. He notes getting out of breath sooner with activity than he used to. This has not gotten worse recently. [name] denies orthopnea.  Diabetes II [name] has a diagnosis of diabetes type II. [name] states patient does not check sugars and denies any hypoglycemic episodes. Last A1c wasNo flowsheet data found.  He has been working on intensive lifestyle modifications including diet, exercise, and weight loss to  help control his blood glucose levels. He does state his FBS generally run 100-110. No recent labs have been drawn.   Hypertension [name] is a 46 y.o. male with hypertension.  [name] denies chest pain or shortness of breath on exertion. He is working weight loss to help control his blood pressure with the goal of decreasing his risk of heart attack and stroke. [name] blood pressure is currently controlled on Losartan.   Hyperlipidemia [name] has hyperlipidemia and has been trying to improve his cholesterol levels with intensive lifestyle modification including a low saturated fat diet, exercise and weight loss. He denies any chest pain, claudication or myalgias. He is currently on a high dose of Lipitor.  At risk for cardiovascular disease [name] is at a higher than average risk for cardiovascular disease due to obesity, Diabetes Mellitus, Hyperlipidemia and Hypertension.  He currently denies any chest pain.  Depression Screen [name] Food and Mood (modified PHQ-9) score was  Depression screen PHQ 2/9 12/05/2016  Decreased Interest 0  Down, Depressed, Hopeless 0  PHQ - 2 Score 0  Altered sleeping 3  Tired, decreased energy 1  Change in appetite 2  Feeling bad or failure about yourself  0  Trouble concentrating 0  Moving slowly or fidgety/restless 0  Suicidal thoughts 0  PHQ-9 Score 6    ALLERGIES: Allergies  Allergen Reactions  . Tramadol     seizures    MEDICATIONS: Current Outpatient Prescriptions on File Prior to Visit  Medication Sig Dispense Refill  . atorvastatin (LIPITOR) 80 MG  tablet     . DILANTIN 100 MG ER capsule     . Levetiracetam 750 MG TB24     . losartan (COZAAR) 25 MG tablet     . metFORMIN (GLUCOPHAGE) 1000 MG tablet     . nabumetone (RELAFEN) 500 MG tablet     . pioglitazone (ACTOS) 45 MG tablet      No current facility-administered medications on file prior to visit.     PAST MEDICAL HISTORY: Past Medical History:  Diagnosis Date  . Back  pain   . Diabetes mellitus without complication ([place])   . Hypertension   . Seizures ([name])   . Sleep apnea    possible    PAST SURGICAL HISTORY: Past Surgical History:  Procedure Laterality Date  . arm fracture, pins inserted     1987  . BREATH TEK H PYLORI N/A 07/08/2013   Procedure: BREATH TEK H PYLORI;  Surgeon: Alexander Jolly, MD;  Location[name];  Service: General;  Laterality: N/A;  . CIRCUMCISION    . kidney stone removal      SOCIAL HISTORY: Social History  Substance Use Topics  . Smoking status: Never Smoker  . Smokeless tobacco: Never Used  . Alcohol use No    FAMILY HISTORY: Family History  Problem Relation Age of Onset  . Cancer Mother   . Sleep apnea Brother   . Hypertension Other   . Obesity Other   . Diabetes Other   . Stroke Other   . Hyperlipidemia Maternal Grandmother   . Stroke Maternal Grandmother   . Cancer Maternal Grandmother     ROS: Review of Systems  Constitutional: Positive for malaise/fatigue.       Trouble Sleeping  Skin:       Dryness  Neurological: Positive for seizures.  Psychiatric/Behavioral:       Stress  All other systems reviewed and are negative.   PHYSICAL EXAM: Blood pressure 138/83, pulse 71, temperature 98.7 F (37.1 C), temperature source Oral, resp. rate 12, height _0  (1.778 m), weight 282 lb (127.9 kg), SpO2 98 %. Body mass index is 40.46 kg/m. Physical Exam  Constitutional: He is oriented to person, place, and time. He appears well-developed and well-nourished.  HENT:  Head: Normocephalic and atraumatic.  Eyes: EOM are normal. Pupils are equal, round, and reactive to light.  Neck: Normal range of motion. Neck supple.  Cardiovascular: Normal rate and regular rhythm.   Pulmonary/Chest: Effort normal and breath sounds normal.  Abdominal: Soft. Bowel sounds are normal.  Musculoskeletal: Normal range of motion. He exhibits edema (on BLE).  Neurological: He is alert and oriented to person, place,  and time.  Skin: Skin is warm and dry.  Psychiatric: He has a normal mood and affect. His behavior is normal.  Vitals reviewed.   RECENT LABS AND TESTS: BMET    Component Value Date/Time   NA 141 07/07/2013 0955   K 4.4 07/07/2013 0955   CL 103 07/07/2013 0955   CO2 32 07/07/2013 0955   GLUCOSE 90 07/07/2013 0955   BUN 13 07/07/2013 0955   CREATININE 0.87 07/07/2013 0955   CALCIUM 9.2 07/07/2013 0955   GFRNONAA >60 02/19/2011 0920   GFRAA  02/19/2011 0920    >60        The eGFR has been calculated using the MDRD equation. This calculation has not been validated in all clinical situations. eGFR's persistently <60 mL/min signify possible Chronic Kidney Disease.   Lab Results  Component Value Date  HGBA1C 5.3 07/07/2013   No results found for: INSULIN CBC    Component Value Date/Time   WBC 5.7 07/07/2013 0955   RBC 5.35 07/07/2013 0955   HGB 15.0 07/07/2013 0955   HCT 44.0 07/07/2013 0955   PLT 236 07/07/2013 0955   MCV 82.2 07/07/2013 0955   MCH 28.0 07/07/2013 0955   MCHC 34.1 07/07/2013 0955   RDW 13.9 07/07/2013 0955   LYMPHSABS 2.4 07/07/2013 0955   MONOABS 0.4 07/07/2013 0955   EOSABS 0.2 07/07/2013 0955   BASOSABS 0.0 07/07/2013 0955   Iron/TIBC/Ferritin/ %Sat No results found for: IRON, TIBC, FERRITIN, IRONPCTSAT Lipid Panel     Component Value Date/Time   CHOL 128 07/07/2013 0955   TRIG 49 07/07/2013 0955   HDL 51 07/07/2013 0955   CHOLHDL 2.5 07/07/2013 0955   VLDL 10 07/07/2013 0955   LDLCALC 67 07/07/2013 0955   Hepatic Function Panel     Component Value Date/Time   PROT 6.7 07/07/2013 0955   ALBUMIN 4.1 07/07/2013 0955   AST 24 07/07/2013 0955   ALT 49 07/07/2013 0955   ALKPHOS 64 07/07/2013 0955   BILITOT 0.4 07/07/2013 0955      Component Value Date/Time   TSH 5.201 (H) 07/07/2013 0955    ECG  shows NSR with a rate of 75 bpm.  INDIRECT CALORIMETER done today shows a VO2 of 320 and a REE of 2228.    ASSESSMENT AND  PLAN: Other fatigue - Plan: EKG 12-Lead, Vitamin B12, CBC With Differential, Comprehensive metabolic panel, Folate, T3, T4, free, TSH, VITAMIN D 25 Hydroxy (Vit-D Deficiency, Fractures)  Shortness of breath on exertion  Type 2 diabetes mellitus without complication, without long-term current use of insulin (HCC) - Plan: Hemoglobin A1c, Insulin, random, Microalbumin / creatinine urine ratio  Essential hypertension  Hyperlipidemia, unspecified hyperlipidemia type - Plan: Lipid Panel With LDL/HDL Ratio  At risk for heart disease  Depression screening  Morbid obesity ([place])  PLAN:  Fatigue [name] was informed that his fatigue may be related to obesity, depression or many other causes. Labs will be ordered, and in the meanwhile [name] has agreed to work on diet, exercise and weight loss to help with fatigue. Proper sleep hygiene was discussed including the need for 7-8 hours of quality sleep each night. A sleep study was not ordered based on symptoms and Epworth score.  Dyspnea on exertion [name] shortness of breath appears to be obesity related and exercise induced. He has agreed to work on weight loss and gradually increase exercise to treat his exercise induced shortness of breath. If [name] follows our instructions and loses weight without improvement of his shortness of breath, we will plan to refer to pulmonology. We will monitor this condition regularly. [name] agrees to this plan.  Diabetes II [name] has been given extensive diabetes education by myself today including ideal fasting and post-prandial blood glucose readings, individual ideal HgA1c goals  and hypoglycemia prevention. We discussed the importance of good blood sugar control to decrease the likelihood of diabetic complications such as nephropathy, neuropathy, limb loss, blindness, coronary artery disease, and death. We discussed the importance of intensive lifestyle modification including diet, exercise and weight loss as the first  line treatment for diabetes. [name] agrees to continue his diabetes medications and will follow up at the agreed upon time. Will check labs and follow up.   Hypertension We discussed sodium restriction, working on healthy weight loss, and a regular exercise program as the means to achieve improved blood  pressure control. [name] agreed with this plan and agreed to follow up as directed. We will continue to monitor his blood pressure as well as his progress with the above lifestyle modifications to include diet and exercise. He will continue his medications as prescribed and will watch for signs of hypotension as he continues his lifestyle modifications.   Hyperlipidemia [name] was informed of the American Heart Association Guidelines emphasizing intensive lifestyle modifications as the first line treatment for hyperlipidemia. We discussed many lifestyle modifications today in depth, and [name] will continue to work on decreasing saturated fats such as fatty red meat, butter and many fried foods. He will also increase vegetables and lean protein in his diet and continue to work on exercise and weight loss efforts. He will continue Lipitor at this time, we will check labs today and follow up.   Cardiovascular risk counselling [name] was given extended (at least 15 minutes) coronary artery disease prevention counseling today. He is 46 y.o. male and has risk factors for heart disease including obesity. We discussed intensive lifestyle modifications today with an emphasis on specific weight loss instructions and strategies. Pt was also informed of the importance of increasing exercise and decreasing saturated fats to help prevent heart disease.  Depression Screen [name] had a mildly positive depression screening. Depression is commonly associated with obesity and often results in emotional eating behaviors. We will monitor this closely and work on CBT to help improve the non-hunger eating patterns. Referral to Psychology  may be required if no improvement is seen as he continues in our clinic.  Obesity Burman is currently in the action stage of change and his goal is to continue with weight loss efforts He has agreed to follow the Category 3 plan Virat has been instructed to work up to a goal of 150 minutes of combined cardio and strengthening exercise per week for weight loss and overall health benefits. We discussed the following Behavioral Modification Stratagies today: increasing lean protein intake, decreasing simple carbohydrates , increasing vegetables, increasing lower sugar fruits and increasing fiber rich foods. Also advised to avoid skipping meals.    [place] has agreed to follow up with our clinic in 2 weeks. He was informed of the importance of frequent follow up visits to maximize his success with intensive lifestyle modifications for his multiple health conditions. He was informed we would discuss his lab results at his next visit unless there is a critical issue that needs to be addressed sooner. [place] agreed to keep his next visit at the agreed upon time to discuss these results.  I, April Moore , am acting as Education administrator for Dennard Nip, MD  I have reviewed the above documentation for accuracy and completeness, and I agree with the above. -Dennard Nip, MD

## 2016-12-06 LAB — T3: T3 TOTAL: 92 ng/dL (ref 71–180)

## 2016-12-06 LAB — COMPREHENSIVE METABOLIC PANEL
ALBUMIN: 4.6 g/dL (ref 3.5–5.5)
ALT: 58 IU/L — AB (ref 0–44)
AST: 25 IU/L (ref 0–40)
Albumin/Globulin Ratio: 1.8 (ref 1.2–2.2)
Alkaline Phosphatase: 72 IU/L (ref 39–117)
BILIRUBIN TOTAL: 0.3 mg/dL (ref 0.0–1.2)
BUN/Creatinine Ratio: 16 (ref 9–20)
BUN: 12 mg/dL (ref 6–24)
CHLORIDE: 104 mmol/L (ref 96–106)
CO2: 26 mmol/L (ref 18–29)
CREATININE: 0.73 mg/dL — AB (ref 0.76–1.27)
Calcium: 9.7 mg/dL (ref 8.7–10.2)
GFR calc non Af Amer: 114 mL/min/{1.73_m2} (ref >59)
GFR, EST AFRICAN AMERICAN: 131 mL/min/{1.73_m2} (ref >59)
GLUCOSE: 89 mg/dL (ref 65–99)
Globulin, Total: 2.5 g/dL (ref 1.5–4.5)
Potassium: 5.1 mmol/L (ref 3.5–5.2)
Sodium: 145 mmol/L — ABNORMAL HIGH (ref 134–144)
TOTAL PROTEIN: 7.1 g/dL (ref 6.0–8.5)

## 2016-12-06 LAB — VITAMIN D 25 HYDROXY (VIT D DEFICIENCY, FRACTURES): Vit D, 25-Hydroxy: 41.6 ng/mL (ref 30.0–100.0)

## 2016-12-06 LAB — CBC WITH DIFFERENTIAL
BASOS ABS: 0 10*3/uL (ref 0.0–0.2)
Basos: 0 %
EOS (ABSOLUTE): 0.1 10*3/uL (ref 0.0–0.4)
Eos: 1 %
HEMOGLOBIN: 15.1 g/dL (ref 13.0–17.7)
Hematocrit: 46.3 % (ref 37.5–51.0)
IMMATURE GRANS (ABS): 0 10*3/uL (ref 0.0–0.1)
Immature Granulocytes: 0 %
LYMPHS: 33 %
Lymphocytes Absolute: 2.3 10*3/uL (ref 0.7–3.1)
MCH: 28.2 pg (ref 26.6–33.0)
MCHC: 32.6 g/dL (ref 31.5–35.7)
MCV: 87 fL (ref 79–97)
MONOCYTES: 7 %
Monocytes Absolute: 0.5 10*3/uL (ref 0.1–0.9)
NEUTROS ABS: 4.1 10*3/uL (ref 1.4–7.0)
NEUTROS PCT: 59 %
RBC: 5.35 x10E6/uL (ref 4.14–5.80)
RDW: 14.3 % (ref 12.3–15.4)
WBC: 7 10*3/uL (ref 3.4–10.8)

## 2016-12-06 LAB — T4, FREE: FREE T4: 1.32 ng/dL (ref 0.82–1.77)

## 2016-12-06 LAB — LIPID PANEL WITH LDL/HDL RATIO
Cholesterol, Total: 122 mg/dL (ref 100–199)
HDL: 48 mg/dL (ref >39)
LDL CALC: 63 mg/dL (ref 0–99)
LDl/HDL Ratio: 1.3 ratio units (ref 0.0–3.6)
Triglycerides: 57 mg/dL (ref 0–149)
VLDL Cholesterol Cal: 11 mg/dL (ref 5–40)

## 2016-12-06 LAB — MICROALBUMIN / CREATININE URINE RATIO
CREATININE, UR: 158.3 mg/dL
MICROALBUM., U, RANDOM: 25.8 ug/mL
Microalb/Creat Ratio: 16.3 mg/g creat (ref 0.0–30.0)

## 2016-12-06 LAB — HEMOGLOBIN A1C
Est. average glucose Bld gHb Est-mCnc: 111 mg/dL
HEMOGLOBIN A1C: 5.5 % (ref 4.8–5.6)

## 2016-12-06 LAB — FOLATE: FOLATE: 18.6 ng/mL (ref >3.0)

## 2016-12-06 LAB — INSULIN, RANDOM: INSULIN: 10.9 u[IU]/mL (ref 2.6–24.9)

## 2016-12-06 LAB — VITAMIN B12: VITAMIN B 12: 910 pg/mL (ref 232–1245)

## 2016-12-06 LAB — TSH: TSH: 2.24 u[IU]/mL (ref 0.450–4.500)

## 2016-12-19 ENCOUNTER — Ambulatory Visit (INDEPENDENT_AMBULATORY_CARE_PROVIDER_SITE_OTHER): Payer: 59 | Admitting: Family Medicine

## 2016-12-19 VITALS — BP 112/75 | HR 83 | Temp 98.6°F | Ht 70.0 in | Wt 275.0 lb

## 2016-12-19 DIAGNOSIS — E669 Obesity, unspecified: Secondary | ICD-10-CM | POA: Diagnosis not present

## 2016-12-19 DIAGNOSIS — E559 Vitamin D deficiency, unspecified: Secondary | ICD-10-CM

## 2016-12-19 DIAGNOSIS — Z6839 Body mass index (BMI) 39.0-39.9, adult: Secondary | ICD-10-CM | POA: Diagnosis not present

## 2016-12-19 DIAGNOSIS — E119 Type 2 diabetes mellitus without complications: Secondary | ICD-10-CM

## 2016-12-19 MED ORDER — VITAMIN D 1000 UNITS PO TABS: 1000.0000 [IU] | ORAL_TABLET | Freq: Every day | ORAL | Status: DC

## 2016-12-19 MED FILL — DILANTIN 100 MG CAPSULE: 100 | 30 days supply | Qty: 150 | Fill #8

## 2016-12-19 NOTE — Progress Notes (Signed)
Office: [phone]  /  Fax: [phone]   HPI:   Chief Complaint: OBESITY [name] is here to discuss his progress with his obesity treatment plan. He is following his eating plan approximately 95 % of the time and states he is exercising 0 minutes 0 times per week. [name] continues to do well losing weight. He's had 2 days of celebration eating and is currently struggling with eating all his food on the category 3 plan. His weight is 275 lb (124.7 kg) today and has had a weight loss of 7 pounds over a period of 2 weeks since his last visit. He has lost 7 lbs since starting treatment with [place].  Vitamin D deficiency [name] has a diagnosis of vitamin D deficiency. He is almost at goal. He is currently on a multivitamin and is not currently taking vit D. He admits fatigue and denies nausea, vomiting or muscle weakness.  Diabetes II [name] has a diagnosis of diabetes type II. He is very well controlled with hgb A1c at 5.5, polyphagia has decreased on low simple carbohydrate diet and Metformin and Actos. He denies nausea or diarrhea. [name] states patient does not check sugars and denies any hypoglycemic episodes. He has been working on intensive lifestyle modifications including diet, exercise, and weight loss to help control his blood glucose levels.  Wt Readings from Last 500 Encounters:  12/19/16 275 lb (124.7 kg)  12/05/16 282 lb (127.9 kg)  08/29/16 269 lb (122 kg)  07/23/13 285 lb 6.4 oz (129.5 kg)  07/01/13 289 lb 3.2 oz (131.2 kg)     ALLERGIES: Allergies  Allergen Reactions  . Tramadol     seizures    MEDICATIONS: Current Outpatient Prescriptions on File Prior to Visit  Medication Sig Dispense Refill  . atorvastatin (LIPITOR) 80 MG tablet     . DILANTIN 100 MG ER capsule     . Levetiracetam 750 MG TB24     . losartan (COZAAR) 25 MG tablet     . metFORMIN (GLUCOPHAGE) 1000 MG tablet     . nabumetone (RELAFEN) 500 MG tablet     . pioglitazone (ACTOS) 45 MG tablet      No  current facility-administered medications on file prior to visit.     PAST MEDICAL HISTORY: Past Medical History:  Diagnosis Date  . Back pain   . Diabetes mellitus without complication (H[facility])   . Hypertension   . Seizures ([facility])   . Sleep apnea    possible    PAST SURGICAL HISTORY: Past Surgical History:  Procedure Laterality Date  . arm fracture, pins inserted     1987  . BREATH TEK H PYLORI N/A 07/08/2013   Procedure: BREATH TEK H PYLORI;  Surgeon: Mariella Saa, MD;  Location: [facility];  Service: General;  Laterality: N/A;  . CIRCUMCISION    . kidney stone removal      SOCIAL HISTORY: Social History  Substance Use Topics  . Smoking status: Never Smoker  . Smokeless tobacco: Never Used  . Alcohol use No    FAMILY HISTORY: Family History  Problem Relation Age of Onset  . Cancer Mother   . Sleep apnea Brother   . Hypertension Other   . Obesity Other   . Diabetes Other   . Stroke Other   . Hyperlipidemia Maternal Grandmother   . Stroke Maternal Grandmother   . Cancer Maternal Grandmother     ROS: Review of Systems  Constitutional: Positive for malaise/fatigue.  Gastrointestinal: Negative for diarrhea,  nausea and vomiting.  Musculoskeletal:       Negative Muscle Weakness   Endo/Heme/Allergies:       Polyphagia Negative Hypoglycemia    PHYSICAL EXAM: Blood pressure 112/75, pulse 83, temperature 98.6 F (37 C), temperature source Oral, height 5\' 10"  (1.778 m), weight 275 lb (124.7 kg), SpO2 95 %. Body mass index is 39.46 kg/m. Physical Exam  Constitutional: He is oriented to person, place, and time. He appears well-developed and well-nourished.  Cardiovascular: Normal rate.   Pulmonary/Chest: Effort normal.  Musculoskeletal: Normal range of motion.  Neurological: He is oriented to person, place, and time.  Skin: Skin is warm and dry.  Psychiatric: He has a normal mood and affect. His behavior is normal.  Vitals reviewed.   RECENT LABS AND  TESTS: BMET    Component Value Date/Time   NA 145 (H) 12/05/2016 1037   K 5.1 12/05/2016 1037   CL 104 12/05/2016 1037   CO2 26 12/05/2016 1037   GLUCOSE 89 12/05/2016 1037   GLUCOSE 90 07/07/2013 0955   BUN 12 12/05/2016 1037   CREATININE 0.73 (L) 12/05/2016 1037   CREATININE 0.87 07/07/2013 0955   CALCIUM 9.7 12/05/2016 1037   GFRNONAA 114 12/05/2016 1037   GFRAA 131 12/05/2016 1037   Lab Results  Component Value Date   HGBA1C 5.5 12/05/2016   HGBA1C 5.3 07/07/2013   Lab Results  Component Value Date   INSULIN 10.9 12/05/2016   CBC    Component Value Date/Time   WBC 7.0 12/05/2016 1037   WBC 5.7 07/07/2013 0955   RBC 5.35 12/05/2016 1037   RBC 5.35 07/07/2013 0955   HGB 15.0 07/07/2013 0955   HCT 46.3 12/05/2016 1037   PLT 236 07/07/2013 0955   MCV 87 12/05/2016 1037   MCH 28.2 12/05/2016 1037   MCH 28.0 07/07/2013 0955   MCHC 32.6 12/05/2016 1037   MCHC 34.1 07/07/2013 0955   RDW 14.3 12/05/2016 1037   LYMPHSABS 2.3 12/05/2016 1037   MONOABS 0.4 07/07/2013 0955   EOSABS 0.1 12/05/2016 1037   BASOSABS 0.0 12/05/2016 1037   Iron/TIBC/Ferritin/ %Sat No results found for: IRON, TIBC, FERRITIN, IRONPCTSAT Lipid Panel     Component Value Date/Time   CHOL 122 12/05/2016 1037   TRIG 57 12/05/2016 1037   HDL 48 12/05/2016 1037   CHOLHDL 2.5 07/07/2013 0955   VLDL 10 07/07/2013 0955   LDLCALC 63 12/05/2016 1037   Hepatic Function Panel     Component Value Date/Time   PROT 7.1 12/05/2016 1037   ALBUMIN 4.6 12/05/2016 1037   AST 25 12/05/2016 1037   ALT 58 (H) 12/05/2016 1037   ALKPHOS 72 12/05/2016 1037   BILITOT 0.3 12/05/2016 1037      Component Value Date/Time   TSH 2.240 12/05/2016 1037   TSH 5.201 (H) 07/07/2013 0955    ASSESSMENT AND PLAN: Vitamin D deficiency - Plan: cholecalciferol (VITAMIN D) 1000 units tablet  Type 2 diabetes mellitus without complication, without long-term current use of insulin (HCC)  Class 2 obesity without serious  comorbidity with body mass index (BMI) of 39.0 to 39.9 in adult, unspecified obesity type  PLAN:  Vitamin D Deficiency [name] was informed that low vitamin D levels contributes to fatigue and are associated with obesity, breast, and colon cancer. He agrees to start to take over the counter Vit D @1 ,000 IU every day and we will re-check labs in 3 months and will follow up for routine testing of vitamin D, at least 2-3  times per year. He was informed of the risk of over-replacement of vitamin D and agrees to not increase his dose unless he discusses this with us first.  Diabetes II [name] has been given extensive diabetes education by myself today including ideal fasting and post-prandial blood glucose readings, individual ideal HgA1c goals  and hypoglycemia prevention. We discussed the importance of good blood sugar control to decrease the likelihood of diabetic complications such as nephropathy, neuropathy, limb loss, blindness, coronary artery disease, and death. We discussed the importance of intensive lifestyle modification including diet, exercise and weight loss as the first line treatment for diabetes. [name] agrees to continue his diabetes medications with the goal to discontinue Actos soon and will follow up at the agreed upon time.  We spent > than 50% of the 30 minute visit on the counseling as documented in the note.   Obesity [name] is currently in the action stage of change. As such, his goal is to continue with weight loss efforts He has agreed to follow the Category 3 plan [name] has been instructed to work up to a goal of 150 minutes of combined cardio and strengthening exercise per week or strengthening exercise 30 to 45 minutes 2 to 3 times per week for weight loss and overall health benefits. We discussed the following Behavioral Modification Stratagies today: increasing lean protein intake, decreasing simple carbohydrates  and increasing lower sugar fruits   [name] has agreed to follow up  with our clinic in 2 weeks. He was informed of the importance of frequent follow up visits to maximize his success with intensive lifestyle modifications for his multiple health conditions.  I, [place] CraneJoanne Murray, am acting as scribe for Quillian Quincearen Diane Hanel, MD  I have reviewed the above documentation for accuracy and completeness, and I agree with the above. -Quillian Quincearen Padme Arriaga, MD

## 2017-01-07 ENCOUNTER — Ambulatory Visit (INDEPENDENT_AMBULATORY_CARE_PROVIDER_SITE_OTHER): Payer: 59 | Admitting: Family Medicine

## 2017-01-07 VITALS — BP 120/80 | HR 77 | Temp 98.6°F | Ht 70.0 in | Wt 268.0 lb

## 2017-01-07 DIAGNOSIS — E669 Obesity, unspecified: Secondary | ICD-10-CM | POA: Diagnosis not present

## 2017-01-07 DIAGNOSIS — E11649 Type 2 diabetes mellitus with hypoglycemia without coma: Secondary | ICD-10-CM | POA: Diagnosis not present

## 2017-01-07 DIAGNOSIS — E559 Vitamin D deficiency, unspecified: Secondary | ICD-10-CM | POA: Diagnosis not present

## 2017-01-08 NOTE — Progress Notes (Signed)
Office: [phone]  /  Fax: [phone]   HPI:   Chief Complaint: OBESITY [name] is here to discuss his progress with his obesity treatment plan. He is following his eating plan approximately 90 % of the time and states he is weight lifting for exercise 2 to 3 times per week. [name] continues to do well with weight loss but is getting bored with some of his meals. He is sometimes not eating all his food and I am concerned he may be hurting his metabolism by decreasing his calories too low. His weight is 268 lb (121.6 kg) today and has had a weight loss of 7 pounds over a period of 2 to 3 weeks since his last visit. He has lost 14 lbs since starting treatment with [place].  Vitamin D deficiency [name] has a diagnosis of vitamin D deficiency. He is currently doing well on OTC vitamin D 1,000 IU daily and denies nausea, vomiting or muscle weakness.  At Risk of Hypoglycemia [name] is at risk of hypoglycemia with diabetes. He is not checking blood sugars often but doesnt feel like he has gotten hypoglycemic. He is currently on metformin and actos. Last Hgb A1c was 5.5.  Wt Readings from Last 500 Encounters:  01/07/17 268 lb (121.6 kg)  12/19/16 275 lb (124.7 kg)  12/05/16 282 lb (127.9 kg)  08/29/16 269 lb (122 kg)  07/23/13 285 lb 6.4 oz (129.5 kg)  07/01/13 289 lb 3.2 oz (131.2 kg)     ALLERGIES: Allergies  Allergen Reactions   Tramadol     seizures    MEDICATIONS: Current Outpatient Prescriptions on File Prior to Visit  Medication Sig Dispense Refill   atorvastatin (LIPITOR) 80 MG tablet      DILANTIN 100 MG ER capsule      Levetiracetam 750 MG TB24      losartan (COZAAR) 25 MG tablet      metFORMIN (GLUCOPHAGE) 1000 MG tablet      nabumetone (RELAFEN) 500 MG tablet      pioglitazone (ACTOS) 45 MG tablet      No current facility-administered medications on file prior to visit.     PAST MEDICAL HISTORY: Past Medical History:  Diagnosis Date   Back pain    Diabetes  mellitus without complication (HCC)    Hypertension    Seizures (HCC)    Sleep apnea    possible    PAST SURGICAL HISTORY: Past Surgical History:  Procedure Laterality Date   arm fracture, pins inserted     1987   BREATH TEK H PYLORI N/A 07/08/2013   Procedure: BREATH TEK H PYLORI;  Surgeon: Mariella Saa, MD;  Location: [facility];  Service: General;  Laterality: N/A;   CIRCUMCISION     kidney stone removal      SOCIAL HISTORY: Social History  Substance Use Topics   Smoking status: Never Smoker   Smokeless tobacco: Never Used   Alcohol use No    FAMILY HISTORY: Family History  Problem Relation Age of Onset   Cancer Mother    Sleep apnea Brother    Hypertension Other    Obesity Other    Diabetes Other    Stroke Other    Hyperlipidemia Maternal Grandmother    Stroke Maternal Grandmother    Cancer Maternal Grandmother     ROS: Review of Systems  Constitutional: Positive for weight loss.  Gastrointestinal: Negative for nausea and vomiting.  Musculoskeletal:       Negative muscle weakness  PHYSICAL EXAM: Blood pressure 120/80, pulse 77, temperature 98.6 F (37 C), temperature source Oral, height 5\' 10"  (1.778 m), weight 268 lb (121.6 kg), SpO2 95 %. Body mass index is 38.45 kg/m. Physical Exam  Constitutional: He is oriented to person, place, and time. He appears well-developed and well-nourished.  Cardiovascular: Normal rate.   Pulmonary/Chest: Effort normal.  Musculoskeletal: Normal range of motion.  Neurological: He is oriented to person, place, and time.  Skin: Skin is warm and dry.  Psychiatric: He has a normal mood and affect. His behavior is normal.  Vitals reviewed.   RECENT LABS AND TESTS: BMET    Component Value Date/Time   NA 145 (H) 12/05/2016 1037   K 5.1 12/05/2016 1037   CL 104 12/05/2016 1037   CO2 26 12/05/2016 1037   GLUCOSE 89 12/05/2016 1037   GLUCOSE 90 07/07/2013 0955   BUN 12 12/05/2016 1037    CREATININE 0.73 (L) 12/05/2016 1037   CREATININE 0.87 07/07/2013 0955   CALCIUM 9.7 12/05/2016 1037   GFRNONAA 114 12/05/2016 1037   GFRAA 131 12/05/2016 1037   Lab Results  Component Value Date   HGBA1C 5.5 12/05/2016   HGBA1C 5.3 07/07/2013   Lab Results  Component Value Date   INSULIN 10.9 12/05/2016   CBC    Component Value Date/Time   WBC 7.0 12/05/2016 1037   WBC 5.7 07/07/2013 0955   RBC 5.35 12/05/2016 1037   RBC 5.35 07/07/2013 0955   HGB 15.0 07/07/2013 0955   HCT 46.3 12/05/2016 1037   PLT 236 07/07/2013 0955   MCV 87 12/05/2016 1037   MCH 28.2 12/05/2016 1037   MCH 28.0 07/07/2013 0955   MCHC 32.6 12/05/2016 1037   MCHC 34.1 07/07/2013 0955   RDW 14.3 12/05/2016 1037   LYMPHSABS 2.3 12/05/2016 1037   MONOABS 0.4 07/07/2013 0955   EOSABS 0.1 12/05/2016 1037   BASOSABS 0.0 12/05/2016 1037   Iron/TIBC/Ferritin/ %Sat No results found for: IRON, TIBC, FERRITIN, IRONPCTSAT Lipid Panel     Component Value Date/Time   CHOL 122 12/05/2016 1037   TRIG 57 12/05/2016 1037   HDL 48 12/05/2016 1037   CHOLHDL 2.5 07/07/2013 0955   VLDL 10 07/07/2013 0955   LDLCALC 63 12/05/2016 1037   Hepatic Function Panel     Component Value Date/Time   PROT 7.1 12/05/2016 1037   ALBUMIN 4.6 12/05/2016 1037   AST 25 12/05/2016 1037   ALT 58 (H) 12/05/2016 1037   ALKPHOS 72 12/05/2016 1037   BILITOT 0.3 12/05/2016 1037      Component Value Date/Time   TSH 2.240 12/05/2016 1037   TSH 5.201 (H) 07/07/2013 0955    ASSESSMENT AND PLAN: Vitamin D deficiency - Plan: cholecalciferol (VITAMIN D) 1000 units tablet  Low blood sugar in diabetes (HCC)  Obesity (BMI 35.0-39.9 without comorbidity)  PLAN:  Vitamin D Deficiency [name] was informed that low vitamin D levels contributes to fatigue and are associated with obesity, breast, and colon cancer. He agrees to continue to take OTC Vit D @1 ,000 IU every day and we will re-check labs in 6 weeks and will follow up for routine  testing of vitamin D, at least 2-3 times per year. He was informed of the risk of over-replacement of vitamin D and agrees to not increase his dose unless he discusses this with [place] first.  At Risk of Hypoglycemia [name] agrees to continue with diet, exercise and weight loss and may need to decrease medications if low blood sugar  becomes an issue. He agrees to follow up with our clinic in 2 weeks.  Obesity [name] is currently in the action stage of change. As such, his goal is to continue with weight loss efforts He has agreed to keep a food journal with 300 to 450 calories and 25+ grams of protein daily and follow the Category 3 plan [name] has been instructed to work up to a goal of 150 minutes of combined cardio and strengthening exercise per week for weight loss and overall health benefits. We discussed the following Behavioral Modification Stratagies today: increasing lean protein intake, work on meal planning and easy cooking plans, dealing with family or coworker sabotage, no skipping meals and avoiding temptations  [name] has agreed to follow up with our clinic in 2 weeks. He was informed of the importance of frequent follow up visits to maximize his success with intensive lifestyle modifications for his multiple health conditions.  I, Nevada CraneJoanne Murray, am acting as scribe for Quillian Quincearen Beasley, MD  I have reviewed the above documentation for accuracy and completeness, and I agree with the above. -Quillian Quincearen Beasley, MD

## 2017-01-09 MED ORDER — VITAMIN D 1000 UNITS PO TABS: 1000.0000 [IU] | ORAL_TABLET | Freq: Every day | ORAL | Status: AC

## 2017-01-11 MED FILL — ATORVASTATIN 80 MG TABLET: 80 | 90 days supply | Qty: 90 | Fill #2

## 2017-01-11 MED FILL — LOSARTAN POTASSIUM 25 MG TA: 25 | 90 days supply | Qty: 90 | Fill #0

## 2017-01-14 MED FILL — DILANTIN 100 MG CAPSULE: 100 | 30 days supply | Qty: 150 | Fill #9

## 2017-01-21 ENCOUNTER — Ambulatory Visit (INDEPENDENT_AMBULATORY_CARE_PROVIDER_SITE_OTHER): Payer: 59 | Admitting: Family Medicine

## 2017-01-21 ENCOUNTER — Encounter (INDEPENDENT_AMBULATORY_CARE_PROVIDER_SITE_OTHER): Payer: Self-pay | Admitting: Family Medicine

## 2017-01-21 VITALS — BP 122/82 | HR 84 | Temp 98.5°F | Resp 14 | Ht 70.0 in | Wt 265.0 lb

## 2017-01-21 DIAGNOSIS — Z6838 Body mass index (BMI) 38.0-38.9, adult: Secondary | ICD-10-CM | POA: Diagnosis not present

## 2017-01-21 DIAGNOSIS — E119 Type 2 diabetes mellitus without complications: Secondary | ICD-10-CM

## 2017-01-21 DIAGNOSIS — E669 Obesity, unspecified: Secondary | ICD-10-CM | POA: Diagnosis not present

## 2017-01-22 NOTE — Progress Notes (Signed)
Office: [phone]  /  Fax: [phone]   HPI:   Chief Complaint: OBESITY [name] is here to discuss his progress with his obesity treatment plan. He is following his eating plan approximately 89 % of the time and states he is exercising 0 minutes 0 times per week. [name] continues to do well with weight loss, did increase eating out. He notes decreased hunger and has done well eating all his protein and vegetables. He is drinking some protein shakes and started on the treadmill for 30 minutes 1 to 2 times per week. His weight is 265 lb (120.2 kg) today and has had a weight loss of 3 pounds over a period of 2 weeks since his last visit. He has lost 17 lbs since starting treatment with us.  Diabetes II [name] has a diagnosis of diabetes type II. [name] states  fasting blood sugars are running in the 90's on average and his highest post prandial glucose was 150. He denies any hypoglycemic episodes. Last A1c was at 5.5  He has been working on intensive lifestyle modifications including diet, exercise, and weight loss to help control his blood glucose levels. He is doing well with diet and his goal is to decrease his medications.  Wt Readings from Last 500 Encounters:  01/21/17 265 lb (120.2 kg)  01/07/17 268 lb (121.6 kg)  12/19/16 275 lb (124.7 kg)  12/05/16 282 lb (127.9 kg)  08/29/16 269 lb (122 kg)  07/23/13 285 lb 6.4 oz (129.5 kg)  07/01/13 289 lb 3.2 oz (131.2 kg)     ALLERGIES: Allergies  Allergen Reactions  . Tramadol     seizures    MEDICATIONS: Current Outpatient Prescriptions on File Prior to Visit  Medication Sig Dispense Refill  . atorvastatin (LIPITOR) 80 MG tablet     . cholecalciferol (VITAMIN D) 1000 units tablet Take 1 tablet (1,000 Units total) by mouth daily.    Marland Kitchen. DILANTIN 100 MG ER capsule     . Levetiracetam 750 MG TB24     . losartan (COZAAR) 25 MG tablet     . metFORMIN (GLUCOPHAGE) 1000 MG tablet     . nabumetone (RELAFEN) 500 MG tablet     . pioglitazone  (ACTOS) 45 MG tablet      No current facility-administered medications on file prior to visit.     PAST MEDICAL HISTORY: Past Medical History:  Diagnosis Date  . Back pain   . Diabetes mellitus without complication (HCC)   . Hypertension   . Seizures (HCC)   . Sleep apnea    possible    PAST SURGICAL HISTORY: Past Surgical History:  Procedure Laterality Date  . arm fracture, pins inserted     1987  . BREATH TEK H PYLORI N/A 07/08/2013   Procedure: BREATH TEK H PYLORI;  Surgeon: Mariella SaaBenjamin T Hoxworth, MD;  [facility];  Service: General;  Laterality: N/A;  . CIRCUMCISION    . kidney stone removal      SOCIAL HISTORY: Social History  Substance Use Topics  . Smoking status: Never Smoker  . Smokeless tobacco: Never Used  . Alcohol use No    FAMILY HISTORY: Family History  Problem Relation Age of Onset  . Cancer Mother   . Sleep apnea Brother   . Hypertension Other   . Obesity Other   . Diabetes Other   . Stroke Other   . Hyperlipidemia Maternal Grandmother   . Stroke Maternal Grandmother   . Cancer Maternal Grandmother  ROS: Review of Systems  Constitutional: Positive for weight loss.  Endo/Heme/Allergies:       Negative hypoglycemia    PHYSICAL EXAM: Blood pressure 122/82, pulse 84, temperature 98.5 F (36.9 C), temperature source Oral, resp. rate 14, height 5\' 10"  (1.778 m), weight 265 lb (120.2 kg), SpO2 96 %. Body mass index is 38.02 kg/m. Physical Exam  Constitutional: He is oriented to person, place, and time. He appears well-developed and well-nourished.  Cardiovascular: Normal rate.   Pulmonary/Chest: Effort normal.  Musculoskeletal: Normal range of motion.  Neurological: He is oriented to person, place, and time.  Skin: Skin is warm and dry.  Psychiatric: He has a normal mood and affect. His behavior is normal.  Vitals reviewed.   RECENT LABS AND TESTS: BMET    Component Value Date/Time   NA 145 (H) 12/05/2016 1037   K 5.1  12/05/2016 1037   CL 104 12/05/2016 1037   CO2 26 12/05/2016 1037   GLUCOSE 89 12/05/2016 1037   GLUCOSE 90 07/07/2013 0955   BUN 12 12/05/2016 1037   CREATININE 0.73 (L) 12/05/2016 1037   CREATININE 0.87 07/07/2013 0955   CALCIUM 9.7 12/05/2016 1037   GFRNONAA 114 12/05/2016 1037   GFRAA 131 12/05/2016 1037   Lab Results  Component Value Date   HGBA1C 5.5 12/05/2016   HGBA1C 5.3 07/07/2013   Lab Results  Component Value Date   INSULIN 10.9 12/05/2016   CBC    Component Value Date/Time   WBC 7.0 12/05/2016 1037   WBC 5.7 07/07/2013 0955   RBC 5.35 12/05/2016 1037   RBC 5.35 07/07/2013 0955   HGB 15.0 07/07/2013 0955   HCT 46.3 12/05/2016 1037   PLT 236 07/07/2013 0955   MCV 87 12/05/2016 1037   MCH 28.2 12/05/2016 1037   MCH 28.0 07/07/2013 0955   MCHC 32.6 12/05/2016 1037   MCHC 34.1 07/07/2013 0955   RDW 14.3 12/05/2016 1037   LYMPHSABS 2.3 12/05/2016 1037   MONOABS 0.4 07/07/2013 0955   EOSABS 0.1 12/05/2016 1037   BASOSABS 0.0 12/05/2016 1037   Iron/TIBC/Ferritin/ %Sat No results found for: IRON, TIBC, FERRITIN, IRONPCTSAT Lipid Panel     Component Value Date/Time   CHOL 122 12/05/2016 1037   TRIG 57 12/05/2016 1037   HDL 48 12/05/2016 1037   CHOLHDL 2.5 07/07/2013 0955   VLDL 10 07/07/2013 0955   LDLCALC 63 12/05/2016 1037   Hepatic Function Panel     Component Value Date/Time   PROT 7.1 12/05/2016 1037   ALBUMIN 4.6 12/05/2016 1037   AST 25 12/05/2016 1037   ALT 58 (H) 12/05/2016 1037   ALKPHOS 72 12/05/2016 1037   BILITOT 0.3 12/05/2016 1037      Component Value Date/Time   TSH 2.240 12/05/2016 1037   TSH 5.201 (H) 07/07/2013 0955    ASSESSMENT AND PLAN: Type 2 diabetes mellitus without complication, without long-term current use of insulin (HCC)  Class 2 obesity without serious comorbidity with body mass index (BMI) of 38.0 to 38.9 in adult, unspecified obesity type  PLAN:  Diabetes II [name] has been given extensive diabetes  education by myself today including ideal fasting and post-prandial blood glucose readings, individual ideal Hgb A1c goals  and hypoglycemia prevention. We discussed the importance of good blood sugar control to decrease the likelihood of diabetic complications such as nephropathy, neuropathy, limb loss, blindness, coronary artery disease, and death. We discussed the importance of intensive lifestyle modification including diet, exercise and weight loss as the first  line treatment for diabetes. Nykolas agrees to continue his diabetes medications and will follow up at the agreed upon time.  We spent > than 50% of the 30 minute visit on the counseling as documented in the note.  Obesity An is currently in the action stage of change. As such, his goal is to continue with weight loss efforts He has agreed to follow the Category 3 plan Langford has been instructed to work up to a goal of 150 minutes of combined cardio and strengthening exercise per week for weight loss and overall health benefits. We discussed the following Behavioral Modification Stratagies today: increasing lean protein intake, decreasing simple carbohydrates , increasing vegetables and increasing lower sugar fruits  Haydyn has agreed to follow up with our clinic in 2 weeks. He was informed of the importance of frequent follow up visits to maximize his success with intensive lifestyle modifications for his multiple health conditions.  I, Nevada Crane, am acting as scribe for Quillian Quince, MD  I have reviewed the above documentation for accuracy and completeness, and I agree with the above. -Quillian Quince, MD

## 2017-02-04 ENCOUNTER — Encounter (INDEPENDENT_AMBULATORY_CARE_PROVIDER_SITE_OTHER): Payer: Self-pay

## 2017-02-04 ENCOUNTER — Ambulatory Visit (INDEPENDENT_AMBULATORY_CARE_PROVIDER_SITE_OTHER): Payer: 59 | Admitting: Family Medicine

## 2017-02-04 DIAGNOSIS — G40209 Localization-related (focal) (partial) symptomatic epilepsy and epileptic syndromes with complex partial seizures, not intractable, without status epilepticus: Secondary | ICD-10-CM | POA: Diagnosis not present

## 2017-02-04 MED FILL — LEVETIRACETAM ER 750 MG TAB: 750 | 15 days supply | Qty: 60 | Fill #0

## 2017-02-05 DIAGNOSIS — R0981 Nasal congestion: Secondary | ICD-10-CM | POA: Diagnosis not present

## 2017-02-05 MED FILL — IPRATROPIUM 0.06% SPRAY: 0.06 | 30 days supply | Qty: 15 | Fill #0

## 2017-02-05 MED FILL — HYDROCODONE-HOMATROPINE SYR: 5-1.5 | 5 days supply | Qty: 100 | Fill #0

## 2017-02-06 MED FILL — metFORMIN HCL 1000 MG TABS: 1000 | 90 days supply | Qty: 180 | Fill #2

## 2017-02-18 MED FILL — DILANTIN 100 MG CAPSULE: 100 | 30 days supply | Qty: 150 | Fill #10

## 2017-02-20 ENCOUNTER — Ambulatory Visit (INDEPENDENT_AMBULATORY_CARE_PROVIDER_SITE_OTHER): Payer: 59 | Admitting: Family Medicine

## 2017-02-20 VITALS — BP 111/75 | HR 75 | Temp 98.5°F | Ht 70.0 in | Wt 265.0 lb

## 2017-02-20 DIAGNOSIS — E669 Obesity, unspecified: Secondary | ICD-10-CM | POA: Diagnosis not present

## 2017-02-20 DIAGNOSIS — Z6838 Body mass index (BMI) 38.0-38.9, adult: Secondary | ICD-10-CM | POA: Diagnosis not present

## 2017-02-20 DIAGNOSIS — E119 Type 2 diabetes mellitus without complications: Secondary | ICD-10-CM | POA: Diagnosis not present

## 2017-02-21 NOTE — Progress Notes (Signed)
Office: [phone]  /  Fax: [phone]   HPI:   Chief Complaint: OBESITY [name] is here to discuss his progress with his obesity treatment plan. He is following his eating plan approximately 80 % of the time and states he is exercising 0 minutes 0 times per week. [name] did well losing weight but had increased celebration eating and also had funeral food recently so regained what he lost. He notes getting bored with dinner and would like more options. His weight is 265 lb (120.2 kg) today and has maintained weight over [name] period of 4 weeks since his last visit. He has lost 17 lbs since starting treatment with [place].  Diabetes II [name] has [name] diagnosis of diabetes type II. [name] struggled more with his diet the last 2 weeks. He increased simple carbohydrates and noted increased polyphagia. [name] didn't bring in blood sugar log today and denies any hypoglycemic episodes. Last A1c was well controlled at 5.5 He has been working on intensive lifestyle modifications including diet, exercise, and weight loss to help control his blood glucose levels.  Wt Readings from Last 500 Encounters:  02/20/17 265 lb (120.2 kg)  01/21/17 265 lb (120.2 kg)  01/07/17 268 lb (121.6 kg)  12/19/16 275 lb (124.7 kg)  12/05/16 282 lb (127.9 kg)  08/29/16 269 lb (122 kg)  07/23/13 285 lb 6.4 oz (129.5 kg)  07/01/13 289 lb 3.2 oz (131.2 kg)     ALLERGIES: Allergies  Allergen Reactions  . Tramadol     seizures    MEDICATIONS: Current Outpatient Prescriptions on File Prior to Visit  Medication Sig Dispense Refill  . atorvastatin (LIPITOR) 80 MG tablet     . cholecalciferol (VITAMIN D) 1000 units tablet Take 1 tablet (1,000 Units total) by mouth daily.    Marland Kitchen DILANTIN 100 MG ER capsule     . Levetiracetam 750 MG TB24     . losartan (COZAAR) 25 MG tablet     . metFORMIN (GLUCOPHAGE) 1000 MG tablet     . nabumetone (RELAFEN) 500 MG tablet     . pioglitazone (ACTOS) 45 MG tablet      No current  facility-administered medications on file prior to visit.     PAST MEDICAL HISTORY: Past Medical History:  Diagnosis Date  . Back pain   . Diabetes mellitus without complication (HCC)   . Hypertension   . Seizures (HCC)   . Sleep apnea    possible    PAST SURGICAL HISTORY: Past Surgical History:  Procedure Laterality Date  . arm fracture, pins inserted     1987  . BREATH TEK H PYLORI N/[name] 07/08/2013   Procedure: BREATH TEK H PYLORI;  Surgeon: Mariella Saa, MD;  Location: [facility];  Service: General;  Laterality: N/[name];  . CIRCUMCISION    . kidney stone removal      SOCIAL HISTORY: Social History  Substance Use Topics  . Smoking status: Never Smoker  . Smokeless tobacco: Never Used  . Alcohol use No    FAMILY HISTORY: Family History  Problem Relation Age of Onset  . Cancer Mother   . Sleep apnea Brother   . Hypertension Other   . Obesity Other   . Diabetes Other   . Stroke Other   . Hyperlipidemia Maternal Grandmother   . Stroke Maternal Grandmother   . Cancer Maternal Grandmother     ROS: Review of Systems  Constitutional: Negative for weight loss.  Endo/Heme/Allergies:       Polyphagia  Negative hypoglycemia    PHYSICAL EXAM: Blood pressure 111/75, pulse 75, temperature 98.5 F (36.9 C), temperature source Oral, height  (1.778 m), weight 265 lb (120.2 kg), SpO2 97 %. Body mass index is 38.02 kg/m. Physical Exam  Constitutional: He is oriented to person, place, and time. He appears well-developed and well-nourished.  Cardiovascular: Normal rate.   Pulmonary/Chest: Effort normal.  Musculoskeletal: Normal range of motion.  Neurological: He is oriented to person, place, and time.  Skin: Skin is warm and dry.  Psychiatric: He has [name] normal mood and affect. His behavior is normal.  Vitals reviewed.   RECENT LABS AND TESTS: BMET    Component Value Date/Time   NA 145 (H) 12/05/2016 1037   K 5.1 12/05/2016 1037   CL 104 12/05/2016 1037    CO2 26 12/05/2016 1037   GLUCOSE 89 12/05/2016 1037   GLUCOSE 90 07/07/2013 0955   BUN 12 12/05/2016 1037   CREATININE 0.73 (L) 12/05/2016 1037   CREATININE 0.87 07/07/2013 0955   CALCIUM 9.7 12/05/2016 1037   GFRNONAA 114 12/05/2016 1037   GFRAA 131 12/05/2016 1037   Lab Results  Component Value Date   HGBA1C 5.5 12/05/2016   HGBA1C 5.3 07/07/2013   Lab Results  Component Value Date   INSULIN 10.9 12/05/2016   CBC    Component Value Date/Time   WBC 7.0 12/05/2016 1037   WBC 5.7 07/07/2013 0955   RBC 5.35 12/05/2016 1037   RBC 5.35 07/07/2013 0955   HGB 15.0 07/07/2013 0955   HCT 46.3 12/05/2016 1037   PLT 236 07/07/2013 0955   MCV 87 12/05/2016 1037   MCH 28.2 12/05/2016 1037   MCH 28.0 07/07/2013 0955   MCHC 32.6 12/05/2016 1037   MCHC 34.1 07/07/2013 0955   RDW 14.3 12/05/2016 1037   LYMPHSABS 2.3 12/05/2016 1037   MONOABS 0.4 07/07/2013 0955   EOSABS 0.1 12/05/2016 1037   BASOSABS 0.0 12/05/2016 1037   Iron/TIBC/Ferritin/ %Sat No results found for: IRON, TIBC, FERRITIN, IRONPCTSAT Lipid Panel     Component Value Date/Time   CHOL 122 12/05/2016 1037   TRIG 57 12/05/2016 1037   HDL 48 12/05/2016 1037   CHOLHDL 2.5 07/07/2013 0955   VLDL 10 07/07/2013 0955   LDLCALC 63 12/05/2016 1037   Hepatic Function Panel     Component Value Date/Time   PROT 7.1 12/05/2016 1037   ALBUMIN 4.6 12/05/2016 1037   AST 25 12/05/2016 1037   ALT 58 (H) 12/05/2016 1037   ALKPHOS 72 12/05/2016 1037   BILITOT 0.3 12/05/2016 1037      Component Value Date/Time   TSH 2.240 12/05/2016 1037   TSH 5.201 (H) 07/07/2013 0955    ASSESSMENT AND PLAN: Type 2 diabetes mellitus without complication, without long-term current use of insulin (HCC)  Class 2 obesity without serious comorbidity with body mass index (BMI) of 38.0 to 38.9 in adult, unspecified obesity type  PLAN:  Diabetes II [name] has been given extensive diabetes education by myself today including ideal fasting  and post-prandial blood glucose readings, individual ideal Hgb A1c goals  and hypoglycemia prevention. We discussed the importance of good blood sugar control to decrease the likelihood of diabetic complications such as nephropathy, neuropathy, limb loss, blindness, coronary artery disease, and death. We discussed the importance of intensive lifestyle modification including diet, exercise and weight loss as the first line treatment for diabetes. [name] agrees to continue his diabetes medications, get back to diet and will follow up at the  agreed upon time.We will re-check labs in 1 month.   We spent > than 50% of the 15 minute visit on the counseling as documented in the note.  Obesity [name] is currently in the action stage of change. As such, his goal is to continue with weight loss efforts He has agreed to keep [name] food journal with 450 to 650 calories and 40+ grams of protein  At supper daily and follow the Category 3 plan [name] has been instructed to work up to [name] goal of 150 minutes of combined cardio and strengthening exercise per week or start walking approximately 30 minutes 3 to 5 times per week for weight loss and overall health benefits. We discussed the following Behavioral Modification Stratagies today: increasing lean protein intake, decreasing simple carbohydrates , increasing vegetables and work on meal planning and easy cooking plans  [name] has agreed to follow up with our clinic in 2 weeks. He was informed of the importance of frequent follow up visits to maximize his success with intensive lifestyle modifications for his multiple health conditions.  I, [place] Crane, am acting as scribe for Quillian Quince, MD  I have reviewed the above documentation for accuracy and completeness, and I agree with the above. -Quillian Quince, MD

## 2017-03-11 ENCOUNTER — Ambulatory Visit (INDEPENDENT_AMBULATORY_CARE_PROVIDER_SITE_OTHER): Payer: 59 | Admitting: Family Medicine

## 2017-03-11 VITALS — BP 103/67 | HR 88 | Temp 98.2°F | Ht 70.0 in | Wt 257.0 lb

## 2017-03-11 DIAGNOSIS — E559 Vitamin D deficiency, unspecified: Secondary | ICD-10-CM

## 2017-03-11 DIAGNOSIS — Z6837 Body mass index (BMI) 37.0-37.9, adult: Secondary | ICD-10-CM | POA: Diagnosis not present

## 2017-03-11 DIAGNOSIS — E669 Obesity, unspecified: Secondary | ICD-10-CM | POA: Diagnosis not present

## 2017-03-12 NOTE — Progress Notes (Signed)
Office: [phone]  /  Fax: [phone]   HPI:   Chief Complaint: OBESITY [name] is here to discuss his progress with his obesity treatment plan. He is on the  follow the Category 3 plan, up to 650 calories at supper and is following his eating plan approximately 90 % of the time. He states he is walking 30 minutes 2 times per week. [name] continues to do well with weight loss, hunger is controlled and he has started to walk for exercise. He is in a weight loss competition with his wife, which he knows is likely going to lead to weight re-gain but he is very competitive. His weight is 257 lb (116.6 kg) today and has had a weight loss of 8 pounds over a period of 3 weeks since his last visit. He has lost 25 lbs since starting treatment with [place].  Vitamin D deficiency [name] has a diagnosis of vitamin D deficiency. He is currently taking OTC vit D 1,000 IU daily and is doing well, not yet at goal but close. Fatigue is improving and he denies nausea, vomiting or muscle weakness.  Wt Readings from Last 500 Encounters:  03/11/17 257 lb (116.6 kg)  02/20/17 265 lb (120.2 kg)  01/21/17 265 lb (120.2 kg)  01/07/17 268 lb (121.6 kg)  12/19/16 275 lb (124.7 kg)  12/05/16 282 lb (127.9 kg)  08/29/16 269 lb (122 kg)  07/23/13 285 lb 6.4 oz (129.5 kg)  07/01/13 289 lb 3.2 oz (131.2 kg)     ALLERGIES: Allergies  Allergen Reactions  . Tramadol     seizures    MEDICATIONS: Current Outpatient Prescriptions on File Prior to Visit  Medication Sig Dispense Refill  . atorvastatin (LIPITOR) 80 MG tablet     . cholecalciferol (VITAMIN D) 1000 units tablet Take 1 tablet (1,000 Units total) by mouth daily.    Marland Kitchen DILANTIN 100 MG ER capsule     . Levetiracetam 750 MG TB24     . losartan (COZAAR) 25 MG tablet     . metFORMIN (GLUCOPHAGE) 1000 MG tablet     . nabumetone (RELAFEN) 500 MG tablet     . pioglitazone (ACTOS) 45 MG tablet      No current facility-administered medications on file prior to  visit.     PAST MEDICAL HISTORY: Past Medical History:  Diagnosis Date  . Back pain   . Diabetes mellitus without complication (HCC)   . Hypertension   . Seizures (HCC)   . Sleep apnea    possible    PAST SURGICAL HISTORY: Past Surgical History:  Procedure Laterality Date  . arm fracture, pins inserted     1987  . BREATH TEK H PYLORI N/A 07/08/2013   Procedure: BREATH TEK H PYLORI;  Surgeon: Mariella Saa, MD;  Location: [facility];  Service: General;  Laterality: N/A;  . CIRCUMCISION    . kidney stone removal      SOCIAL HISTORY: Social History  Substance Use Topics  . Smoking status: Never Smoker  . Smokeless tobacco: Never Used  . Alcohol use No    FAMILY HISTORY: Family History  Problem Relation Age of Onset  . Cancer Mother   . Sleep apnea Brother   . Hypertension Other   . Obesity Other   . Diabetes Other   . Stroke Other   . Hyperlipidemia Maternal Grandmother   . Stroke Maternal Grandmother   . Cancer Maternal Grandmother     ROS: Review of Systems  Constitutional:  Positive for weight loss.  Gastrointestinal: Negative for nausea and vomiting.  Musculoskeletal:       Negative muscle weakness    PHYSICAL EXAM: Blood pressure 103/67, pulse 88, temperature 98.2 F (36.8 C), temperature source Oral, height 5\' 10"  (1.778 m), weight 257 lb (116.6 kg), SpO2 95 %. Body mass index is 36.88 kg/m. Physical Exam  Constitutional: He is oriented to person, place, and time. He appears well-developed and well-nourished.  Cardiovascular: Normal rate.   Pulmonary/Chest: Effort normal.  Musculoskeletal: Normal range of motion.  Neurological: He is oriented to person, place, and time.  Skin: Skin is warm and dry.  Psychiatric: He has a normal mood and affect. His behavior is normal.  Vitals reviewed.   RECENT LABS AND TESTS: BMET    Component Value Date/Time   NA 145 (H) 12/05/2016 1037   K 5.1 12/05/2016 1037   CL 104 12/05/2016 1037   CO2 26  12/05/2016 1037   GLUCOSE 89 12/05/2016 1037   GLUCOSE 90 07/07/2013 0955   BUN 12 12/05/2016 1037   CREATININE 0.73 (L) 12/05/2016 1037   CREATININE 0.87 07/07/2013 0955   CALCIUM 9.7 12/05/2016 1037   GFRNONAA 114 12/05/2016 1037   GFRAA 131 12/05/2016 1037   Lab Results  Component Value Date   HGBA1C 5.5 12/05/2016   HGBA1C 5.3 07/07/2013   Lab Results  Component Value Date   INSULIN 10.9 12/05/2016   CBC    Component Value Date/Time   WBC 7.0 12/05/2016 1037   WBC 5.7 07/07/2013 0955   RBC 5.35 12/05/2016 1037   RBC 5.35 07/07/2013 0955   HGB 15.0 07/07/2013 0955   HCT 46.3 12/05/2016 1037   PLT 236 07/07/2013 0955   MCV 87 12/05/2016 1037   MCH 28.2 12/05/2016 1037   MCH 28.0 07/07/2013 0955   MCHC 32.6 12/05/2016 1037   MCHC 34.1 07/07/2013 0955   RDW 14.3 12/05/2016 1037   LYMPHSABS 2.3 12/05/2016 1037   MONOABS 0.4 07/07/2013 0955   EOSABS 0.1 12/05/2016 1037   BASOSABS 0.0 12/05/2016 1037   Iron/TIBC/Ferritin/ %Sat No results found for: IRON, TIBC, FERRITIN, IRONPCTSAT Lipid Panel     Component Value Date/Time   CHOL 122 12/05/2016 1037   TRIG 57 12/05/2016 1037   HDL 48 12/05/2016 1037   CHOLHDL 2.5 07/07/2013 0955   VLDL 10 07/07/2013 0955   LDLCALC 63 12/05/2016 1037   Hepatic Function Panel     Component Value Date/Time   PROT 7.1 12/05/2016 1037   ALBUMIN 4.6 12/05/2016 1037   AST 25 12/05/2016 1037   ALT 58 (H) 12/05/2016 1037   ALKPHOS 72 12/05/2016 1037   BILITOT 0.3 12/05/2016 1037      Component Value Date/Time   TSH 2.240 12/05/2016 1037   TSH 5.201 (H) 07/07/2013 0955    ASSESSMENT AND PLAN: Vitamin D deficiency  Class 2 obesity without serious comorbidity with body mass index (BMI) of 37.0 to 37.9 in adult, unspecified obesity type  PLAN:  Vitamin D Deficiency [name] was informed that low vitamin D levels contributes to fatigue and are associated with obesity, breast, and colon cancer. He agrees to continue to take OTC  Vit D @1 ,000 IU every day and we will re-check labs in 2 weeks and will follow up for routine testing of vitamin D, at least 2-3 times per year. He was informed of the risk of over-replacement of vitamin D and agrees to not increase his dose unless he discusses this with [place] first.  Minerva Areolaric agrees to follow up with our clinic in 2 weeks.  Obesity Minerva Areolaric is currently in the action stage of change. As such, his goal is to continue with weight loss efforts He has agreed to follow the Category 3 plan Minerva Areolaric has been instructed to work up to a goal of 150 minutes of combined cardio and strengthening exercise per week for weight loss and overall health benefits. We discussed the following Behavioral Modification Strategies today: increasing vegetables and no skipping meals.Minerva Areolaric was advised, do not cut calories lower than 1500 per day.   Minerva Areolaric has agreed to follow up with our clinic in 2 weeks. He was informed of the importance of frequent follow up visits to maximize his success with intensive lifestyle modifications for his multiple health conditions.  I, [place] CraneJoanne Murray, am acting as scribe for Quillian Quincearen Rhilynn Preyer, MD  I have reviewed the above documentation for accuracy and completeness, and I agree with the above. -Quillian Quincearen Nylani Michetti, MD

## 2017-03-18 MED FILL — DILANTIN 100 MG CAPSULE: 100 | 30 days supply | Qty: 150 | Fill #11

## 2017-03-27 ENCOUNTER — Ambulatory Visit (INDEPENDENT_AMBULATORY_CARE_PROVIDER_SITE_OTHER): Payer: 59 | Admitting: Family Medicine

## 2017-03-27 VITALS — BP 116/73 | HR 87 | Temp 98.3°F | Ht 70.0 in | Wt 255.0 lb

## 2017-03-27 DIAGNOSIS — E669 Obesity, unspecified: Secondary | ICD-10-CM | POA: Diagnosis not present

## 2017-03-27 DIAGNOSIS — E66812 Obesity, class 2: Secondary | ICD-10-CM | POA: Insufficient documentation

## 2017-03-27 DIAGNOSIS — Z6836 Body mass index (BMI) 36.0-36.9, adult: Secondary | ICD-10-CM | POA: Diagnosis not present

## 2017-03-27 DIAGNOSIS — E119 Type 2 diabetes mellitus without complications: Secondary | ICD-10-CM | POA: Insufficient documentation

## 2017-03-28 NOTE — Progress Notes (Signed)
Office: [phone]  /  Fax: [phone]   HPI:   Chief Complaint: OBESITY [name] is here to discuss his progress with his obesity treatment plan. He is on the  follow the Category 3 plan and is following his eating plan approximately 95 % of the time. He states he is walking/running/push ups for 35 minutes 3 times per week. [name] continues to do well with weight loss. He had some deviations but states he is ready to get back on track. He is not eating all his protein. His weight is 255 lb (115.7 kg) today and has had a weight loss of 2 pounds over a period of 2 weeks since his last visit. He has lost 27 lbs since starting treatment with us.  Diabetes II [name] has a diagnosis of diabetes type II. He is currently on Metformin. [name] states he is not checking blood sugars at home. Last A1c was at 5.5. He has been working on intensive lifestyle modifications including diet, exercise, and weight loss to help control his blood glucose levels.   ALLERGIES: Allergies  Allergen Reactions   Tramadol     seizures    MEDICATIONS: Current Outpatient Prescriptions on File Prior to Visit  Medication Sig Dispense Refill   atorvastatin (LIPITOR) 80 MG tablet      cholecalciferol (VITAMIN D) 1000 units tablet Take 1 tablet (1,000 Units total) by mouth daily.     DILANTIN 100 MG ER capsule      Levetiracetam 750 MG TB24      losartan (COZAAR) 25 MG tablet      metFORMIN (GLUCOPHAGE) 1000 MG tablet      nabumetone (RELAFEN) 500 MG tablet      pioglitazone (ACTOS) 45 MG tablet      No current facility-administered medications on file prior to visit.     PAST MEDICAL HISTORY: Past Medical History:  Diagnosis Date   Back pain    Diabetes mellitus without complication (HCC)    Hypertension    Seizures (HCC)    Sleep apnea    possible    PAST SURGICAL HISTORY: Past Surgical History:  Procedure Laterality Date   arm fracture, pins inserted     1987   BREATH TEK H PYLORI  N/A 07/08/2013   Procedure: BREATH TEK H PYLORI;  Surgeon: Mariella SaaBenjamin T Hoxworth, MD;  Location: [facility];  Service: General;  Laterality: N/A;   CIRCUMCISION     kidney stone removal      SOCIAL HISTORY: Social History  Substance Use Topics   Smoking status: Never Smoker   Smokeless tobacco: Never Used   Alcohol use No    FAMILY HISTORY: Family History  Problem Relation Age of Onset   Cancer Mother    Sleep apnea Brother    Hypertension Other    Obesity Other    Diabetes Other    Stroke Other    Hyperlipidemia Maternal Grandmother    Stroke Maternal Grandmother    Cancer Maternal Grandmother     ROS: Review of Systems  Constitutional: Positive for weight loss.    PHYSICAL EXAM: Blood pressure 116/73, pulse 87, temperature 98.3 F (36.8 C), temperature source Oral, height 5\' 10"  (1.778 m), weight 255 lb (115.7 kg), SpO2 96 %. Body mass index is 36.59 kg/m. Physical Exam  Constitutional: He is oriented to person, place, and time. He appears well-developed and well-nourished.  Cardiovascular: Normal rate.   Pulmonary/Chest: Effort normal.  Musculoskeletal: Normal range of motion.  Neurological: He is oriented  to person, place, and time.  Skin: Skin is warm and dry.  Psychiatric: He has a normal mood and affect. His behavior is normal.  Vitals reviewed.   RECENT LABS AND TESTS: BMET    Component Value Date/Time   NA 145 (H) 12/05/2016 1037   K 5.1 12/05/2016 1037   CL 104 12/05/2016 1037   CO2 26 12/05/2016 1037   GLUCOSE 89 12/05/2016 1037   GLUCOSE 90 07/07/2013 0955   BUN 12 12/05/2016 1037   CREATININE 0.73 (L) 12/05/2016 1037   CREATININE 0.87 07/07/2013 0955   CALCIUM 9.7 12/05/2016 1037   GFRNONAA 114 12/05/2016 1037   GFRAA 131 12/05/2016 1037   Lab Results  Component Value Date   HGBA1C 5.5 12/05/2016   HGBA1C 5.3 07/07/2013   Lab Results  Component Value Date   INSULIN 10.9 12/05/2016   CBC    Component Value Date/Time     WBC 7.0 12/05/2016 1037   WBC 5.7 07/07/2013 0955   RBC 5.35 12/05/2016 1037   RBC 5.35 07/07/2013 0955   HGB 15.0 07/07/2013 0955   HCT 46.3 12/05/2016 1037   PLT 236 07/07/2013 0955   MCV 87 12/05/2016 1037   MCH 28.2 12/05/2016 1037   MCH 28.0 07/07/2013 0955   MCHC 32.6 12/05/2016 1037   MCHC 34.1 07/07/2013 0955   RDW 14.3 12/05/2016 1037   LYMPHSABS 2.3 12/05/2016 1037   MONOABS 0.4 07/07/2013 0955   EOSABS 0.1 12/05/2016 1037   BASOSABS 0.0 12/05/2016 1037   Iron/TIBC/Ferritin/ %Sat No results found for: IRON, TIBC, FERRITIN, IRONPCTSAT Lipid Panel     Component Value Date/Time   CHOL 122 12/05/2016 1037   TRIG 57 12/05/2016 1037   HDL 48 12/05/2016 1037   CHOLHDL 2.5 07/07/2013 0955   VLDL 10 07/07/2013 0955   LDLCALC 63 12/05/2016 1037   Hepatic Function Panel     Component Value Date/Time   PROT 7.1 12/05/2016 1037   ALBUMIN 4.6 12/05/2016 1037   AST 25 12/05/2016 1037   ALT 58 (H) 12/05/2016 1037   ALKPHOS 72 12/05/2016 1037   BILITOT 0.3 12/05/2016 1037      Component Value Date/Time   TSH 2.240 12/05/2016 1037   TSH 5.201 (H) 07/07/2013 0955    ASSESSMENT AND PLAN: Type 2 diabetes mellitus without complication, without long-term current use of insulin (HCC)  Class 2 obesity without serious comorbidity with body mass index (BMI) of 36.0 to 36.9 in adult, unspecified obesity type  PLAN:  Diabetes II [name] has been given extensive diabetes education by myself today including ideal fasting and post-prandial blood glucose readings, individual ideal Hgb A1c goals  and hypoglycemia prevention. We discussed the importance of good blood sugar control to decrease the likelihood of diabetic complications such as nephropathy, neuropathy, limb loss, blindness, coronary artery disease, and death. We discussed the importance of intensive lifestyle modification including diet, exercise and weight loss as the first line treatment for diabetes. [name] agrees to  continue to work on diet and continue to take Metformin and will follow up at agreed upon time.    We spent > than 50% of the 15 minute visit on the counseling as documented in the note.  Obesity [name] is currently in the action stage of change. As such, his goal is to continue with weight loss efforts He has agreed to keep a food journal with 650 calories and 35 grams of protein at supper daily  [name] has been instructed to work up to  a goal of 150 minutes of combined cardio and strengthening exercise per week for weight loss and overall health benefits. We discussed the following Behavioral Modification Strategies today: increasing lean protein intake and decrease eating out  [name] has agreed to follow up with our clinic in 2 to 3 weeks. He was informed of the importance of frequent follow up visits to maximize his success with intensive lifestyle modifications for his multiple health conditions.  I, [place] Crane, am acting as scribe for Quillian Quince, MD  I have reviewed the above documentation for accuracy and completeness, and I agree with the above. -Quillian Quince, MD  OBESITY BEHAVIORAL INTERVENTION VISIT  Today's visit was # 7 out of 22.  Starting weight: 282 lbs Starting date: 12/05/16 Todays weight : 255 lbs Total lbs lost to date: 46 (Patients must lose 7 lbs in the first 6 months to continue with counseling)   ASK: We discussed the diagnosis of obesity with [name] today and [name] agreed to give [place] permission to discuss obesity behavioral modification therapy today.  ASSESS: [name] has the diagnosis of obesity and his BMI today is @TBMI @ [name] is in the action stage of change   ADVISE: [name] was educated on the multiple health risks of obesity as well as the benefit of weight loss to improve his health. He was advised of the need for long term treatment and the importance of lifestyle modifications.  AGREE: Multiple dietary modification options and treatment options  were discussed and  [facility] agreed to keep a food journal with 650 calories and 35 grams of  protein at supper daily We discussed the following Behavioral Modification Strategies today: increasing lean protein intake and decrease eating out

## 2017-04-08 MED FILL — LOSARTAN POTASSIUM 25 MG TA: 25 | 90 days supply | Qty: 90 | Fill #1

## 2017-04-09 DIAGNOSIS — G4733 Obstructive sleep apnea (adult) (pediatric): Secondary | ICD-10-CM | POA: Diagnosis not present

## 2017-04-15 ENCOUNTER — Ambulatory Visit (INDEPENDENT_AMBULATORY_CARE_PROVIDER_SITE_OTHER): Payer: 59 | Admitting: Family Medicine

## 2017-04-15 VITALS — BP 137/84 | HR 80 | Temp 98.2°F | Ht 70.0 in | Wt 260.0 lb

## 2017-04-15 DIAGNOSIS — E669 Obesity, unspecified: Secondary | ICD-10-CM

## 2017-04-15 DIAGNOSIS — Z6837 Body mass index (BMI) 37.0-37.9, adult: Secondary | ICD-10-CM | POA: Diagnosis not present

## 2017-04-15 DIAGNOSIS — E559 Vitamin D deficiency, unspecified: Secondary | ICD-10-CM

## 2017-04-15 DIAGNOSIS — E784 Other hyperlipidemia: Secondary | ICD-10-CM

## 2017-04-15 DIAGNOSIS — E119 Type 2 diabetes mellitus without complications: Secondary | ICD-10-CM | POA: Diagnosis not present

## 2017-04-15 DIAGNOSIS — E7849 Other hyperlipidemia: Secondary | ICD-10-CM

## 2017-04-15 MED ORDER — METFORMIN HCL 1000 MG PO TABS: 1000.0000 mg | ORAL_TABLET | Freq: Every day | ORAL | 0 refills | Status: DC

## 2017-04-15 NOTE — Progress Notes (Signed)
Office: [phone]  /  Fax: [phone]   HPI:   Chief Complaint: OBESITY [name] is here to discuss his progress with his obesity treatment plan. He is on the  keep a food journal with 400 calories for breakfast and 600 calories for dinner calories and 35 grams of protein  and is following his eating plan approximately 80 % of the time. He states he is walking/running 30 to 40 minutes 2 times per week. [name] is off track over the last 2 weeks. He had increased celebration eating and now feels bloated. He is ready to get back on track. His weight is 260 lb (117.9 kg) today and has had a weight gain of 5 pounds over a period of 2 to 3 weeks since his last visit. He has lost 22 lbs since starting treatment with [place].  Diabetes II [name] has a diagnosis of diabetes type II and is currently stable. [name] is not checking blood sugar. He has been working on intensive lifestyle modifications including diet, exercise, and weight loss to help control his blood glucose levels. [name] has done well with diet until now.  Vitamin D deficiency [name] has a diagnosis of vitamin D deficiency. He is currently stable on vit D and denies nausea, vomiting or muscle weakness.  Hyperlipidemia [name] has hyperlipidemia and is on lipitor 80 mg has been trying to improve his cholesterol levels with intensive lifestyle modification including a low saturated fat diet, exercise and weight loss. He denies any chest pain, claudication or myalgias.   ALLERGIES: Allergies  Allergen Reactions  . Tramadol     seizures    MEDICATIONS: Current Outpatient Prescriptions on File Prior to Visit  Medication Sig Dispense Refill  . atorvastatin (LIPITOR) 80 MG tablet     . cholecalciferol (VITAMIN D) 1000 units tablet Take 1 tablet (1,000 Units total) by mouth daily.    Marland Kitchen DILANTIN 100 MG ER capsule     . Levetiracetam 750 MG TB24     . losartan (COZAAR) 25 MG tablet     . nabumetone (RELAFEN) 500 MG tablet     . pioglitazone  (ACTOS) 45 MG tablet      No current facility-administered medications on file prior to visit.     PAST MEDICAL HISTORY: Past Medical History:  Diagnosis Date  . Back pain   . Diabetes mellitus without complication (HCC)   . Hypertension   . Seizures (HCC)   . Sleep apnea    possible    PAST SURGICAL HISTORY: Past Surgical History:  Procedure Laterality Date  . arm fracture, pins inserted     1987  . BREATH TEK H PYLORI N/A 07/08/2013   Procedure: BREATH TEK H PYLORI;  Surgeon: Mariella Saa, MD;  Location: [facility];  Service: General;  Laterality: N/A;  . CIRCUMCISION    . kidney stone removal      SOCIAL HISTORY: Social History  Substance Use Topics  . Smoking status: Never Smoker  . Smokeless tobacco: Never Used  . Alcohol use No    FAMILY HISTORY: Family History  Problem Relation Age of Onset  . Cancer Mother   . Sleep apnea Brother   . Hypertension Other   . Obesity Other   . Diabetes Other   . Stroke Other   . Hyperlipidemia Maternal Grandmother   . Stroke Maternal Grandmother   . Cancer Maternal Grandmother     ROS: Review of Systems  Constitutional: Negative for weight loss.  Cardiovascular: Negative for  chest pain and claudication.  Gastrointestinal: Negative for nausea and vomiting.  Musculoskeletal: Negative for myalgias.       Negative muscle weakness    PHYSICAL EXAM: Blood pressure 137/84, pulse 80, temperature 98.2 F (36.8 C), temperature source Oral, height 5\' 10"  (1.778 m), weight 260 lb (117.9 kg), SpO2 96 %. Body mass index is 37.31 kg/m. Physical Exam  Constitutional: He is oriented to person, place, and time. He appears well-developed and well-nourished.  Cardiovascular: Normal rate.   Pulmonary/Chest: Effort normal.  Musculoskeletal: Normal range of motion.  Neurological: He is oriented to person, place, and time.  Skin: Skin is warm and dry.  Psychiatric: He has a normal mood and affect. His behavior is normal.    Vitals reviewed.   RECENT LABS AND TESTS: BMET    Component Value Date/Time   NA 145 (H) 12/05/2016 1037   K 5.1 12/05/2016 1037   CL 104 12/05/2016 1037   CO2 26 12/05/2016 1037   GLUCOSE 89 12/05/2016 1037   GLUCOSE 90 07/07/2013 0955   BUN 12 12/05/2016 1037   CREATININE 0.73 (L) 12/05/2016 1037   CREATININE 0.87 07/07/2013 0955   CALCIUM 9.7 12/05/2016 1037   GFRNONAA 114 12/05/2016 1037   GFRAA 131 12/05/2016 1037   Lab Results  Component Value Date   HGBA1C 5.5 12/05/2016   HGBA1C 5.3 07/07/2013   Lab Results  Component Value Date   INSULIN 10.9 12/05/2016   CBC    Component Value Date/Time   WBC 7.0 12/05/2016 1037   WBC 5.7 07/07/2013 0955   RBC 5.35 12/05/2016 1037   RBC 5.35 07/07/2013 0955   HGB 15.1 12/05/2016 1037   HCT 46.3 12/05/2016 1037   PLT 236 07/07/2013 0955   MCV 87 12/05/2016 1037   MCH 28.2 12/05/2016 1037   MCH 28.0 07/07/2013 0955   MCHC 32.6 12/05/2016 1037   MCHC 34.1 07/07/2013 0955   RDW 14.3 12/05/2016 1037   LYMPHSABS 2.3 12/05/2016 1037   MONOABS 0.4 07/07/2013 0955   EOSABS 0.1 12/05/2016 1037   BASOSABS 0.0 12/05/2016 1037   Iron/TIBC/Ferritin/ %Sat No results found for: IRON, TIBC, FERRITIN, IRONPCTSAT Lipid Panel     Component Value Date/Time   CHOL 122 12/05/2016 1037   TRIG 57 12/05/2016 1037   HDL 48 12/05/2016 1037   CHOLHDL 2.5 07/07/2013 0955   VLDL 10 07/07/2013 0955   LDLCALC 63 12/05/2016 1037   Hepatic Function Panel     Component Value Date/Time   PROT 7.1 12/05/2016 1037   ALBUMIN 4.6 12/05/2016 1037   AST 25 12/05/2016 1037   ALT 58 (H) 12/05/2016 1037   ALKPHOS 72 12/05/2016 1037   BILITOT 0.3 12/05/2016 1037      Component Value Date/Time   TSH 2.240 12/05/2016 1037   TSH 5.201 (H) 07/07/2013 0955    ASSESSMENT AND PLAN: Type 2 diabetes mellitus without complication, without long-term current use of insulin (HCC) - Plan: Comprehensive metabolic panel, Hemoglobin A1c, Insulin, random,  metFORMIN (GLUCOPHAGE) 1000 MG tablet  Vitamin D deficiency - Plan: VITAMIN D 25 Hydroxy (Vit-D Deficiency, Fractures)  Other hyperlipidemia - Plan: Lipid Panel With LDL/HDL Ratio  Class 2 obesity without serious comorbidity with body mass index (BMI) of 37.0 to 37.9 in adult, unspecified obesity type  PLAN:  Diabetes II Romon has been given extensive diabetes education by myself today including ideal fasting and post-prandial blood glucose readings, individual ideal Hgb A1c goals  and hypoglycemia prevention. We discussed the importance of  good blood sugar control to decrease the likelihood of diabetic complications such as nephropathy, neuropathy, limb loss, blindness, coronary artery disease, and death. We discussed the importance of intensive lifestyle modification including diet, exercise and weight loss as the first line treatment for diabetes. [name] agrees to continue to take metformin, we will refill for 1 month and he will follow up at the agreed upon time.  Vitamin D Deficiency [name] was informed that low vitamin D levels contributes to fatigue and are associated with obesity, breast, and colon cancer. He agrees to continue to take prescription Vit D @50 ,000 IU every week and we will check labs and will follow up for routine testing of vitamin D, at least 2-3 times per year. He was informed of the risk of over-replacement of vitamin D and agrees to not increase his dose unless he discusses this with us first. [name] agrees to follow up with our clinic in 2 weeks.  Hyperlipidemia [name] was informed of the American Heart Association Guidelines emphasizing intensive lifestyle modifications as the first line treatment for hyperlipidemia. We discussed many lifestyle modifications today in depth, and [name] will continue to work on decreasing saturated fats such as fatty red meat, butter and many fried foods. He will also increase vegetables and lean protein in his diet and continue to work on exercise  and weight loss efforts. We will check labs and [name] agrees to continue to take lipitor and will follow up with our clinic in 2 weeks.  Obesity [name] is currently in the action stage of change. As such, his goal is to continue with weight loss efforts He has agreed to follow the Category 3 plan [name] has been instructed to work up to a goal of 150 minutes of combined cardio and strengthening exercise per week for weight loss and overall health benefits. We discussed the following Behavioral Modification Strategies today: meal planning & cooking strategies and keeping healthy foods in the home  [name] has agreed to follow up with our clinic in 2 weeks. He was informed of the importance of frequent follow up visits to maximize his success with intensive lifestyle modifications for his multiple health conditions.  I, [place] CraneJoanne Murray, am acting as scribe for Quillian Quincearen Dayanis Bergquist, MD  I have reviewed the above documentation for accuracy and completeness, and I agree with the above. -Quillian Quincearen Cayne Yom, MD  OBESITY BEHAVIORAL INTERVENTION VISIT  Today's visit was # 8 out of 22.  Starting weight: 282 lbs Starting date: 12/05/16 Today's weight : 260 lbs Today's date: 04/15/2017 Total lbs lost to date: 22 (Patients must lose 7 lbs in the first 6 months to continue with counseling)   ASK: We discussed the diagnosis of obesity with [name] today and [name] agreed to give us permission to discuss obesity behavioral modification therapy today.  ASSESS: [name] has the diagnosis of obesity and his BMI today is 37.4 [name] is in the action stage of change   ADVISE: [name] was educated on the multiple health risks of obesity as well as the benefit of weight loss to improve his health. He was advised of the need for long term treatment and the importance of lifestyle modifications.  AGREE: Multiple dietary modification options and treatment options were discussed and  [name] agreed to follow the Category 3 plan We  discussed the following Behavioral Modification Strategies today: Meal planning & cooking strategies and keeping healthy foods in the home.

## 2017-04-16 LAB — LIPID PANEL WITH LDL/HDL RATIO
Cholesterol, Total: 115 mg/dL (ref 100–199)
HDL: 46 mg/dL (ref >39)
LDL CALC: 59 mg/dL (ref 0–99)
LDL/HDL RATIO: 1.3 ratio (ref 0.0–3.6)
Triglycerides: 50 mg/dL (ref 0–149)
VLDL Cholesterol Cal: 10 mg/dL (ref 5–40)

## 2017-04-16 LAB — COMPREHENSIVE METABOLIC PANEL
ALBUMIN: 4.2 g/dL (ref 3.5–5.5)
ALK PHOS: 71 IU/L (ref 39–117)
ALT: 53 IU/L — AB (ref 0–44)
AST: 26 IU/L (ref 0–40)
Albumin/Globulin Ratio: 1.6 (ref 1.2–2.2)
BILIRUBIN TOTAL: 0.4 mg/dL (ref 0.0–1.2)
BUN / CREAT RATIO: 24 — AB (ref 9–20)
BUN: 12 mg/dL (ref 6–24)
CHLORIDE: 104 mmol/L (ref 96–106)
CO2: 23 mmol/L (ref 20–29)
CREATININE: 0.5 mg/dL — AB (ref 0.76–1.27)
Calcium: 9.3 mg/dL (ref 8.7–10.2)
GFR calc non Af Amer: 132 mL/min/{1.73_m2} (ref >59)
GFR, EST AFRICAN AMERICAN: 152 mL/min/{1.73_m2} (ref >59)
GLUCOSE: 85 mg/dL (ref 65–99)
Globulin, Total: 2.6 g/dL (ref 1.5–4.5)
Potassium: 4.5 mmol/L (ref 3.5–5.2)
Sodium: 142 mmol/L (ref 134–144)
TOTAL PROTEIN: 6.8 g/dL (ref 6.0–8.5)

## 2017-04-16 LAB — HEMOGLOBIN A1C
Est. average glucose Bld gHb Est-mCnc: 100 mg/dL
HEMOGLOBIN A1C: 5.1 % (ref 4.8–5.6)

## 2017-04-16 LAB — VITAMIN D 25 HYDROXY (VIT D DEFICIENCY, FRACTURES): Vit D, 25-Hydroxy: 55.4 ng/mL (ref 30.0–100.0)

## 2017-04-16 LAB — INSULIN, RANDOM: INSULIN: 9.5 u[IU]/mL (ref 2.6–24.9)

## 2017-04-19 MED FILL — ATORVASTATIN 80 MG TABLET: 80 | 90 days supply | Qty: 90 | Fill #3

## 2017-04-19 MED FILL — DILANTIN 100 MG CAPSULE: 100 | 90 days supply | Qty: 360 | Fill #0

## 2017-04-22 DIAGNOSIS — G4733 Obstructive sleep apnea (adult) (pediatric): Secondary | ICD-10-CM | POA: Diagnosis not present

## 2017-05-02 ENCOUNTER — Ambulatory Visit (INDEPENDENT_AMBULATORY_CARE_PROVIDER_SITE_OTHER): Payer: 59 | Admitting: Family Medicine

## 2017-05-02 VITALS — BP 119/77 | HR 67 | Temp 98.7°F | Ht 70.0 in | Wt 259.0 lb

## 2017-05-02 DIAGNOSIS — I10 Essential (primary) hypertension: Secondary | ICD-10-CM | POA: Diagnosis not present

## 2017-05-02 DIAGNOSIS — Z6837 Body mass index (BMI) 37.0-37.9, adult: Secondary | ICD-10-CM

## 2017-05-02 DIAGNOSIS — E119 Type 2 diabetes mellitus without complications: Secondary | ICD-10-CM | POA: Diagnosis not present

## 2017-05-02 DIAGNOSIS — IMO0001 Reserved for inherently not codable concepts without codable children: Secondary | ICD-10-CM

## 2017-05-02 DIAGNOSIS — E669 Obesity, unspecified: Secondary | ICD-10-CM

## 2017-05-02 MED ORDER — METFORMIN HCL 1000 MG PO TABS: 1000.0000 mg | ORAL_TABLET | Freq: Every day | ORAL | 0 refills | Status: DC

## 2017-05-03 MED FILL — metFORMIN HCL 1000 MG TABS: 1000 | 30 days supply | Qty: 30 | Fill #0

## 2017-05-03 NOTE — Progress Notes (Signed)
Office: [phone]  /  Fax: [phone]   HPI:   Chief Complaint: OBESITY [name] is here to discuss his progress with his obesity treatment plan. He is on the  follow the Category 3 plan and is following his eating plan approximately 75 % of the time. He states he is walking 2 to 3 times per week. [name] continues to do well, has been using the CPAP for the past 2 weeks and sleep is improving. His weight is 259 lb (117.5 kg) today and has had a weight loss of 1 pound over a period of 2 weeks since his last visit. He has lost 23 lbs since starting treatment with [place].  Diabetes II [name] has a diagnosis of diabetes type II. [name] states he is not checking GGs at home and denies any hypoglycemic episodes. He has been working on intensive lifestyle modifications including diet, exercise, and weight loss to help control his blood glucose levels.  Hypertension [name] is a 46 y.o. male with hypertension. His blood pressure is stable. [name] denies chest pain, headache or shortness of breath on exertion. He is working weight loss to help control his blood pressure with the goal of decreasing his risk of heart attack and stroke. [name] blood pressure is currently controlled.   ALLERGIES: Allergies  Allergen Reactions  . Tramadol     seizures    MEDICATIONS: Current Outpatient Prescriptions on File Prior to Visit  Medication Sig Dispense Refill  . atorvastatin (LIPITOR) 80 MG tablet     . cholecalciferol (VITAMIN D) 1000 units tablet Take 1 tablet (1,000 Units total) by mouth daily.    Marland Kitchen DILANTIN 100 MG ER capsule     . Levetiracetam 750 MG TB24     . losartan (COZAAR) 25 MG tablet     . nabumetone (RELAFEN) 500 MG tablet     . pioglitazone (ACTOS) 45 MG tablet      No current facility-administered medications on file prior to visit.     PAST MEDICAL HISTORY: Past Medical History:  Diagnosis Date  . Back pain   . Diabetes mellitus without complication (HCC)   .  Hypertension   . Seizures (HCC)   . Sleep apnea    possible    PAST SURGICAL HISTORY: Past Surgical History:  Procedure Laterality Date  . arm fracture, pins inserted     1987  . BREATH TEK H PYLORI N/A 07/08/2013   Procedure: BREATH TEK H PYLORI;  Surgeon: Mariella Saa, MD;  Location: [facility];  Service: General;  Laterality: N/A;  . CIRCUMCISION    . kidney stone removal      SOCIAL HISTORY: Social History  Substance Use Topics  . Smoking status: Never Smoker  . Smokeless tobacco: Never Used  . Alcohol use No    FAMILY HISTORY: Family History  Problem Relation Age of Onset  . Cancer Mother   . Sleep apnea Brother   . Hypertension Other   . Obesity Other   . Diabetes Other   . Stroke Other   . Hyperlipidemia Maternal Grandmother   . Stroke Maternal Grandmother   . Cancer Maternal Grandmother     ROS: Review of Systems  Constitutional: Positive for weight loss.  Respiratory: Negative for shortness of breath (on exertion).   Cardiovascular: Negative for chest pain.  Neurological: Negative for headaches.  Endo/Heme/Allergies:       Negative hypoglycemia    PHYSICAL EXAM: Blood pressure 119/77, pulse 67, temperature 98.7  F (37.1 C), temperature source Oral, height 5\' 10"  (1.778 m), weight 259 lb (117.5 kg), SpO2 98 %. Body mass index is 37.16 kg/m. Physical Exam  Constitutional: He is oriented to person, place, and time. He appears well-developed and well-nourished.  Cardiovascular: Normal rate.   Pulmonary/Chest: Effort normal.  Musculoskeletal: Normal range of motion.  Neurological: He is oriented to person, place, and time.  Skin: Skin is warm and dry.  Psychiatric: He has a normal mood and affect. His behavior is normal.  Vitals reviewed.   RECENT LABS AND TESTS: BMET    Component Value Date/Time   NA 142 04/15/2017 0817   K 4.5 04/15/2017 0817   CL 104 04/15/2017 0817   CO2 23 04/15/2017 0817   GLUCOSE 85 04/15/2017 0817   GLUCOSE  90 07/07/2013 0955   BUN 12 04/15/2017 0817   CREATININE 0.50 (L) 04/15/2017 0817   CREATININE 0.87 07/07/2013 0955   CALCIUM 9.3 04/15/2017 0817   GFRNONAA 132 04/15/2017 0817   GFRAA 152 04/15/2017 0817   Lab Results  Component Value Date   HGBA1C 5.1 04/15/2017   HGBA1C 5.5 12/05/2016   HGBA1C 5.3 07/07/2013   Lab Results  Component Value Date   INSULIN 9.5 04/15/2017   INSULIN 10.9 12/05/2016   CBC    Component Value Date/Time   WBC 7.0 12/05/2016 1037   WBC 5.7 07/07/2013 0955   RBC 5.35 12/05/2016 1037   RBC 5.35 07/07/2013 0955   HGB 15.1 12/05/2016 1037   HCT 46.3 12/05/2016 1037   PLT 236 07/07/2013 0955   MCV 87 12/05/2016 1037   MCH 28.2 12/05/2016 1037   MCH 28.0 07/07/2013 0955   MCHC 32.6 12/05/2016 1037   MCHC 34.1 07/07/2013 0955   RDW 14.3 12/05/2016 1037   LYMPHSABS 2.3 12/05/2016 1037   MONOABS 0.4 07/07/2013 0955   EOSABS 0.1 12/05/2016 1037   BASOSABS 0.0 12/05/2016 1037   Iron/TIBC/Ferritin/ %Sat No results found for: IRON, TIBC, FERRITIN, IRONPCTSAT Lipid Panel     Component Value Date/Time   CHOL 115 04/15/2017 0817   TRIG 50 04/15/2017 0817   HDL 46 04/15/2017 0817   CHOLHDL 2.5 07/07/2013 0955   VLDL 10 07/07/2013 0955   LDLCALC 59 04/15/2017 0817   Hepatic Function Panel     Component Value Date/Time   PROT 6.8 04/15/2017 0817   ALBUMIN 4.2 04/15/2017 0817   AST 26 04/15/2017 0817   ALT 53 (H) 04/15/2017 0817   ALKPHOS 71 04/15/2017 0817   BILITOT 0.4 04/15/2017 0817      Component Value Date/Time   TSH 2.240 12/05/2016 1037   TSH 5.201 (H) 07/07/2013 0955    ASSESSMENT AND PLAN: Type 2 diabetes mellitus without complication, without long-term current use of insulin (HCC) - Plan: metFORMIN (GLUCOPHAGE) 1000 MG tablet  Essential hypertension  Class 2 obesity with serious comorbidity and body mass index (BMI) of 37.0 to 37.9 in adult, unspecified obesity type  PLAN:  Diabetes II Pacer has been given extensive  diabetes education by myself today including ideal fasting and post-prandial blood glucose readings, individual ideal Hgb A1c goals  and hypoglycemia prevention. We discussed the importance of good blood sugar control to decrease the likelihood of diabetic complications such as nephropathy, neuropathy, limb loss, blindness, coronary artery disease, and death. We discussed the importance of intensive lifestyle modification including diet, exercise and weight loss as the first line treatment for diabetes. Essie agrees to continue metformin 1,000 mg qd #30 with no refills  and will follow up at the agreed upon time.  Hypertension We discussed sodium restriction, working on healthy weight loss, and a regular exercise program as the means to achieve improved blood pressure control. [place] agreed with this plan and agreed to follow up as directed. We will continue to monitor his blood pressure as well as his progress with the above lifestyle modifications. He will continue his medications as prescribed and will watch for signs of hypotension as he continues his lifestyle modifications.  Obesity Minerva Areolaric is currently in the action stage of change. As such, his goal is to continue with weight loss efforts He has agreed to follow the Category 3 plan Minerva Areolaric has been instructed to work up to a goal of 150 minutes of combined cardio and strengthening exercise per week for weight loss and overall health benefits. We discussed the following Behavioral Modification Strategies today: increasing lean protein intake and meal planning & cooking strategies  Minerva Areolaric has agreed to follow up with our clinic in 2 to 3 weeks. He was informed of the importance of frequent follow up visits to maximize his success with intensive lifestyle modifications for his multiple health conditions.  I, [place] CraneJoanne Murray, am acting as transcriptionist for Quillian Quincearen Neha Waight, MD  I have reviewed the above documentation for accuracy and completeness, and I agree  with the above. -Quillian Quincearen Mishti Swanton, MD  OBESITY BEHAVIORAL INTERVENTION VISIT  Today's visit was # 9 out of 22.  Starting weight: 282 lbs Starting date: 12/05/16 Today's weight : 259 lbs Today's date: 05/02/2017 Total lbs lost to date: 23 lbs (Patients must lose 7 lbs in the first 6 months to continue with counseling)   ASK: We discussed the diagnosis of obesity with Phoebe SharpsEric A Gulley today and Minerva Areolaric agreed to give us permission to discuss obesity behavioral modification therapy today.  ASSESS: Minerva Areolaric has the diagnosis of obesity and his BMI today is 37.2 Minerva Areolaric is in the action stage of change   ADVISE: Minerva Areolaric was educated on the multiple health risks of obesity as well as the benefit of weight loss to improve his health. He was advised of the need for long term treatment and the importance of lifestyle modifications.  AGREE: Multiple dietary modification options and treatment options were discussed and  Minerva Areolaric agreed to follow the Category 3 plan We discussed the following Behavioral Modification Stratagies today: increasing lean protein intake and meal planning & cooking strategies

## 2017-05-06 DIAGNOSIS — G40209 Localization-related (focal) (partial) symptomatic epilepsy and epileptic syndromes with complex partial seizures, not intractable, without status epilepticus: Secondary | ICD-10-CM | POA: Diagnosis not present

## 2017-05-16 ENCOUNTER — Ambulatory Visit (INDEPENDENT_AMBULATORY_CARE_PROVIDER_SITE_OTHER): Payer: 59 | Admitting: Physician Assistant

## 2017-05-16 VITALS — BP 115/74 | HR 82 | Temp 98.6°F | Ht 70.0 in | Wt 254.0 lb

## 2017-05-16 DIAGNOSIS — E669 Obesity, unspecified: Secondary | ICD-10-CM | POA: Diagnosis not present

## 2017-05-16 DIAGNOSIS — E559 Vitamin D deficiency, unspecified: Secondary | ICD-10-CM | POA: Diagnosis not present

## 2017-05-16 DIAGNOSIS — IMO0001 Reserved for inherently not codable concepts without codable children: Secondary | ICD-10-CM | POA: Insufficient documentation

## 2017-05-16 DIAGNOSIS — E119 Type 2 diabetes mellitus without complications: Secondary | ICD-10-CM | POA: Diagnosis not present

## 2017-05-16 DIAGNOSIS — Z6836 Body mass index (BMI) 36.0-36.9, adult: Secondary | ICD-10-CM

## 2017-05-16 DIAGNOSIS — G40909 Epilepsy, unspecified, not intractable, without status epilepticus: Secondary | ICD-10-CM | POA: Diagnosis not present

## 2017-05-16 DIAGNOSIS — E782 Mixed hyperlipidemia: Secondary | ICD-10-CM | POA: Diagnosis not present

## 2017-05-16 DIAGNOSIS — Z23 Encounter for immunization: Secondary | ICD-10-CM | POA: Diagnosis not present

## 2017-05-16 DIAGNOSIS — Z0000 Encounter for general adult medical examination without abnormal findings: Secondary | ICD-10-CM | POA: Diagnosis not present

## 2017-05-16 DIAGNOSIS — Z800 Family history of malignant neoplasm of digestive organs: Secondary | ICD-10-CM | POA: Diagnosis not present

## 2017-05-16 DIAGNOSIS — G4733 Obstructive sleep apnea (adult) (pediatric): Secondary | ICD-10-CM | POA: Diagnosis not present

## 2017-05-16 DIAGNOSIS — Z87442 Personal history of urinary calculi: Secondary | ICD-10-CM | POA: Diagnosis not present

## 2017-05-16 DIAGNOSIS — I10 Essential (primary) hypertension: Secondary | ICD-10-CM | POA: Diagnosis not present

## 2017-05-16 DIAGNOSIS — E118 Type 2 diabetes mellitus with unspecified complications: Secondary | ICD-10-CM | POA: Diagnosis not present

## 2017-05-16 DIAGNOSIS — R809 Proteinuria, unspecified: Secondary | ICD-10-CM | POA: Diagnosis not present

## 2017-05-16 NOTE — Progress Notes (Signed)
Office: [phone]  /  Fax: [phone]   HPI:   Chief Complaint: OBESITY [name] is here to discuss his progress with his obesity treatment plan. He is on the  follow the Category 3 plan and is following his eating plan approximately 100 % of the time. He states he is walking and running up steps for 45 minutes 3 times per week. [name] continues to do well with weight loss. He is planning ahead well but skips some meals because he is not hungry. States has been using his CPAP for sleep and wakes up well rested, but feels more fatigued in the evening and naps more.  His weight is 254 lb (115.2 kg) today and has had a weight loss of 5 pounds over a period of 2 weeks since his last visit. He has lost 28 lbs since starting treatment with us.  Diabetes II [name] has a diagnosis of diabetes type II. [name] states patient does not check sugars regularly but lowest BGs in the 90s. and denies any hypoglycemic episodes. Last A1c was 5.1 on 04/15/17.  He has been working on intensive lifestyle modifications including diet, exercise, and weight loss to help control his blood glucose levels.  Vitamin D deficiency [name] has a diagnosis of vitamin D deficiency. He is currently taking vit D and denies nausea, vomiting or muscle weakness.   ALLERGIES: Allergies  Allergen Reactions  . Tramadol     seizures    MEDICATIONS: Current Outpatient Prescriptions on File Prior to Visit  Medication Sig Dispense Refill  . atorvastatin (LIPITOR) 80 MG tablet     . cholecalciferol (VITAMIN D) 1000 units tablet Take 1 tablet (1,000 Units total) by mouth daily.    Marland Kitchen. DILANTIN 100 MG ER capsule     . Levetiracetam 750 MG TB24     . losartan (COZAAR) 25 MG tablet     . metFORMIN (GLUCOPHAGE) 1000 MG tablet Take 1 tablet (1,000 mg total) by mouth daily with breakfast. 30 tablet 0  . nabumetone (RELAFEN) 500 MG tablet     . pioglitazone (ACTOS) 45 MG tablet      No current facility-administered medications on file prior  to visit.     PAST MEDICAL HISTORY: Past Medical History:  Diagnosis Date  . Back pain   . Diabetes mellitus without complication (HCC)   . Hypertension   . Seizures (HCC)   . Sleep apnea    possible    PAST SURGICAL HISTORY: Past Surgical History:  Procedure Laterality Date  . arm fracture, pins inserted     1987  . BREATH TEK H PYLORI N/A 07/08/2013   Procedure: BREATH TEK H PYLORI;  Surgeon: Mariella SaaBenjamin T Hoxworth, MD;  Location: [facility];  Service: General;  Laterality: N/A;  . CIRCUMCISION    . kidney stone removal      SOCIAL HISTORY: Social History  Substance Use Topics  . Smoking status: Never Smoker  . Smokeless tobacco: Never Used  . Alcohol use No    FAMILY HISTORY: Family History  Problem Relation Age of Onset  . Cancer Mother   . Sleep apnea Brother   . Hypertension Other   . Obesity Other   . Diabetes Other   . Stroke Other   . Hyperlipidemia Maternal Grandmother   . Stroke Maternal Grandmother   . Cancer Maternal Grandmother     ROS: Review of Systems  Constitutional: Positive for weight loss.  Gastrointestinal: Negative for nausea and vomiting.  Musculoskeletal:  Negative muscle weakness  Endo/Heme/Allergies:       Negative hypoglycemia     PHYSICAL EXAM: Blood pressure 115/74, pulse 82, temperature 98.6 F (37 C), temperature source Oral, height 5\' 10"  (1.778 m), weight 254 lb (115.2 kg), SpO2 96 %. Body mass index is 36.45 kg/m. Physical Exam  Constitutional: He is oriented to person, place, and time. He appears well-developed and well-nourished.  Cardiovascular: Normal rate.   Pulmonary/Chest: Effort normal.  Musculoskeletal: Normal range of motion.  Neurological: He is alert and oriented to person, place, and time.  Skin: Skin is warm and dry.    RECENT LABS AND TESTS: BMET    Component Value Date/Time   NA 142 04/15/2017 0817   K 4.5 04/15/2017 0817   CL 104 04/15/2017 0817   CO2 23 04/15/2017 0817   GLUCOSE 85  04/15/2017 0817   GLUCOSE 90 07/07/2013 0955   BUN 12 04/15/2017 0817   CREATININE 0.50 (L) 04/15/2017 0817   CREATININE 0.87 07/07/2013 0955   CALCIUM 9.3 04/15/2017 0817   GFRNONAA 132 04/15/2017 0817   GFRAA 152 04/15/2017 0817   Lab Results  Component Value Date   HGBA1C 5.1 04/15/2017   HGBA1C 5.5 12/05/2016   HGBA1C 5.3 07/07/2013   Lab Results  Component Value Date   INSULIN 9.5 04/15/2017   INSULIN 10.9 12/05/2016   CBC    Component Value Date/Time   WBC 7.0 12/05/2016 1037   WBC 5.7 07/07/2013 0955   RBC 5.35 12/05/2016 1037   RBC 5.35 07/07/2013 0955   HGB 15.1 12/05/2016 1037   HCT 46.3 12/05/2016 1037   PLT 236 07/07/2013 0955   MCV 87 12/05/2016 1037   MCH 28.2 12/05/2016 1037   MCH 28.0 07/07/2013 0955   MCHC 32.6 12/05/2016 1037   MCHC 34.1 07/07/2013 0955   RDW 14.3 12/05/2016 1037   LYMPHSABS 2.3 12/05/2016 1037   MONOABS 0.4 07/07/2013 0955   EOSABS 0.1 12/05/2016 1037   BASOSABS 0.0 12/05/2016 1037   Iron/TIBC/Ferritin/ %Sat No results found for: IRON, TIBC, FERRITIN, IRONPCTSAT Lipid Panel     Component Value Date/Time   CHOL 115 04/15/2017 0817   TRIG 50 04/15/2017 0817   HDL 46 04/15/2017 0817   CHOLHDL 2.5 07/07/2013 0955   VLDL 10 07/07/2013 0955   LDLCALC 59 04/15/2017 0817   Hepatic Function Panel     Component Value Date/Time   PROT 6.8 04/15/2017 0817   ALBUMIN 4.2 04/15/2017 0817   AST 26 04/15/2017 0817   ALT 53 (H) 04/15/2017 0817   ALKPHOS 71 04/15/2017 0817   BILITOT 0.4 04/15/2017 0817      Component Value Date/Time   TSH 2.240 12/05/2016 1037   TSH 5.201 (H) 07/07/2013 0955    ASSESSMENT AND PLAN: Type 2 diabetes mellitus without complication, without long-term current use of insulin (HCC)  Vitamin D deficiency  Class 2 obesity with serious comorbidity and body mass index (BMI) of 36.0 to 36.9 in adult, unspecified obesity type  PLAN:  Diabetes II [name] has been given extensive diabetes education by  myself today including ideal fasting and post-prandial blood glucose readings, individual ideal HgA1c goals  and hypoglycemia prevention. He is advised to check BGs when he is more fatigued and wants to nap in the evening, as this can be a sign of hypoglycemia. We discussed the importance of good blood sugar control to decrease the likelihood of diabetic complications such as nephropathy, neuropathy, limb loss, blindness, coronary artery disease, and death. We  discussed the importance of intensive lifestyle modification including diet, exercise and weight loss as the first line treatment for diabetes. Alem agrees to continue his diabetes medications and will follow up at the agreed upon time.  Vitamin D Deficiency [name] was informed that low vitamin D levels contributes to fatigue and are associated with obesity, breast, and colon cancer. He agrees to continue to take prescription Vit D @50 ,000 IU every week and will follow up for routine testing of vitamin D, at least 2-3 times per year. He was informed of the risk of over-replacement of vitamin D and agrees to not increase his dose unless he discusses this with [place] first.  Obesity [name] is currently in the action stage of change. As such, his goal is to continue with weight loss efforts He has agreed to follow the Category 3 plan [name] has been instructed to work up to a goal of 150 minutes of combined cardio and strengthening exercise per week for weight loss and overall health benefits. We discussed the following Behavioral Modification Stratagies today: increasing lean protein intake and avoid skipping meals.   [name] has agreed to follow up with our clinic in 2 weeks. He was informed of the importance of frequent follow up visits to maximize his success with intensive lifestyle modifications for his multiple health conditions.   Office: [phone]  /  Fax: [phone]  OBESITY BEHAVIORAL INTERVENTION VISIT  Today's visit was # 10 out of  22.  Starting weight: 282 Starting date: 12/05/16 Today's weight : Weight: 254 lb (115.2 kg)  Today's date: 05/23/2017 Total lbs lost to date: 38 (Patients must lose 7 lbs in the first 6 months to continue with counseling)   ASK: We discussed the diagnosis of obesity with [name] today and [name] agreed to give [place] permission to discuss obesity behavioral modification therapy today.  ASSESS: [name] has the diagnosis of obesity and his BMI today is @TBMI @ [name] is in the action stage of change   ADVISE: [name] was educated on the multiple health risks of obesity as well as the benefit of weight loss to improve his health. He was advised of the need for long term treatment and the importance of lifestyle modifications.  AGREE: Multiple dietary modification options and treatment options were discussed and  [name] agreed to follow the Category 3 plan We discussed the following Behavioral Modification Stratagies today: increasing lean protein intake and avoid skipping meals.   We spent > than 50% of the 15 minute visit on the counseling as documented in the note.  I have reviewed the above documentation for accuracy and completeness, and I agree with the above. -Illa Level, PA-C  I have reviewed the above note and agree with the plan. -Quillian Quince, MD

## 2017-05-22 DIAGNOSIS — G4733 Obstructive sleep apnea (adult) (pediatric): Secondary | ICD-10-CM | POA: Diagnosis not present

## 2017-05-27 MED FILL — metFORMIN HCL 1000 MG TABS: 1000 | 90 days supply | Qty: 180 | Fill #3

## 2017-05-30 ENCOUNTER — Ambulatory Visit (INDEPENDENT_AMBULATORY_CARE_PROVIDER_SITE_OTHER): Payer: 59 | Admitting: Physician Assistant

## 2017-05-30 VITALS — BP 112/74 | HR 89 | Temp 98.1°F | Ht 70.0 in | Wt 258.0 lb

## 2017-05-30 DIAGNOSIS — I10 Essential (primary) hypertension: Secondary | ICD-10-CM

## 2017-05-30 DIAGNOSIS — Z9189 Other specified personal risk factors, not elsewhere classified: Secondary | ICD-10-CM | POA: Diagnosis not present

## 2017-05-30 DIAGNOSIS — E669 Obesity, unspecified: Secondary | ICD-10-CM | POA: Diagnosis not present

## 2017-05-30 DIAGNOSIS — IMO0001 Reserved for inherently not codable concepts without codable children: Secondary | ICD-10-CM

## 2017-05-30 DIAGNOSIS — E119 Type 2 diabetes mellitus without complications: Secondary | ICD-10-CM

## 2017-05-30 DIAGNOSIS — Z6837 Body mass index (BMI) 37.0-37.9, adult: Secondary | ICD-10-CM

## 2017-05-30 MED ORDER — GLUCOSE BLOOD VI STRP: ORAL_STRIP | 0 refills | Status: AC

## 2017-05-30 MED ORDER — ONETOUCH ULTRASOFT LANCETS MISC: 0 refills | Status: AC

## 2017-05-30 NOTE — Progress Notes (Signed)
Office: [phone]  /  Fax: [phone]   HPI:   Chief Complaint: OBESITY [name] is here to discuss his progress with his obesity treatment plan. He is on the  follow the Category 3 plan and is following his eating plan approximately 50 % of the time. He states he is exercising 30-45  minutes 4 times per week. [name] had increase celebration eating and got off track. He is now motivated to get back on track.  His weight is 258 lb (117 kg) today and has gained 4 pounds since his last visit. He has lost 24 lbs since starting treatment with us.  Diabetes II [name] has a diagnosis of diabetes type II.  [name] states has had episode of hypoglycemia with BS in the 50-60s.  Last A1c was 5.1 on 04/15/17.  He has been working on intensive lifestyle modifications including diet, exercise, and weight loss to help control his blood glucose levels.  Hypertension [name] is a 46 y.o. male with hypertension.  [name] denies chest pain or shortness of breath on exertion. He is working weight loss to help control his blood pressure with the goal of decreasing his risk of heart attack and stroke. Erics blood pressure is currently controlled.  At risk for cardiovascular disease [name] is at a higher than average risk for cardiovascular disease due to obesity. He currently denies any chest pain.    ALLERGIES: Allergies  Allergen Reactions  . Tramadol     seizures    MEDICATIONS: Current Outpatient Prescriptions on File Prior to Visit  Medication Sig Dispense Refill  . atorvastatin (LIPITOR) 80 MG tablet     . cholecalciferol (VITAMIN D) 1000 units tablet Take 1 tablet (1,000 Units total) by mouth daily.    Marland Kitchen. DILANTIN 100 MG ER capsule     . Levetiracetam 750 MG TB24     . losartan (COZAAR) 25 MG tablet     . metFORMIN (GLUCOPHAGE) 1000 MG tablet Take 1 tablet (1,000 mg total) by mouth daily with breakfast. (Patient taking differently: Take 1,000 mg by mouth 2 (two) times daily with a meal. )  30 tablet 0  . nabumetone (RELAFEN) 500 MG tablet      No current facility-administered medications on file prior to visit.     PAST MEDICAL HISTORY: Past Medical History:  Diagnosis Date  . Back pain   . Diabetes mellitus without complication (HCC)   . Hypertension   . Seizures (HCC)   . Sleep apnea    possible    PAST SURGICAL HISTORY: Past Surgical History:  Procedure Laterality Date  . arm fracture, pins inserted     1987  . BREATH TEK H PYLORI N/A 07/08/2013   Procedure: BREATH TEK H PYLORI;  Surgeon: [name];  Location: [facility];  Service: General;  Laterality: N/A;  . CIRCUMCISION    . kidney stone removal      SOCIAL HISTORY: Social History  Substance Use Topics  . Smoking status: Never Smoker  . Smokeless tobacco: Never Used  . Alcohol use No    FAMILY HISTORY: Family History  Problem Relation Age of Onset  . Cancer Mother   . Sleep apnea Brother   . Hypertension Other   . Obesity Other   . Diabetes Other   . Stroke Other   . Hyperlipidemia Maternal Grandmother   . Stroke Maternal Grandmother   . Cancer Maternal Grandmother     ROS: Review of Systems  Respiratory:  Negative for shortness of breath.   Cardiovascular: Negative for chest pain.  Gastrointestinal: Negative for nausea and vomiting.    PHYSICAL EXAM: Blood pressure 112/74, pulse 89, temperature 98.1 F (36.7 C), height 5\' 10"  (1.778 m), weight 258 lb (117 kg), SpO2 95 %. Body mass index is 37.02 kg/m. Physical Exam  Constitutional: He is oriented to person, place, and time. He appears well-developed and well-nourished.  Cardiovascular: Normal rate.   Pulmonary/Chest: Effort normal.  Musculoskeletal: Normal range of motion.  Neurological: He is alert and oriented to person, place, and time.  Skin: Skin is warm and dry.  Psychiatric: He has a normal mood and affect.    RECENT LABS AND TESTS: BMET    Component Value Date/Time   NA 142 04/15/2017 0817   K 4.5  04/15/2017 0817   CL 104 04/15/2017 0817   CO2 23 04/15/2017 0817   GLUCOSE 85 04/15/2017 0817   GLUCOSE 90 07/07/2013 0955   BUN 12 04/15/2017 0817   CREATININE 0.50 (L) 04/15/2017 0817   CREATININE 0.87 07/07/2013 0955   CALCIUM 9.3 04/15/2017 0817   GFRNONAA 132 04/15/2017 0817   GFRAA 152 04/15/2017 0817   Lab Results  Component Value Date   HGBA1C 5.1 04/15/2017   HGBA1C 5.5 12/05/2016   HGBA1C 5.3 07/07/2013   Lab Results  Component Value Date   INSULIN 9.5 04/15/2017   INSULIN 10.9 12/05/2016   CBC    Component Value Date/Time   WBC 7.0 12/05/2016 1037   WBC 5.7 07/07/2013 0955   RBC 5.35 12/05/2016 1037   RBC 5.35 07/07/2013 0955   HGB 15.1 12/05/2016 1037   HCT 46.3 12/05/2016 1037   PLT 236 07/07/2013 0955   MCV 87 12/05/2016 1037   MCH 28.2 12/05/2016 1037   MCH 28.0 07/07/2013 0955   MCHC 32.6 12/05/2016 1037   MCHC 34.1 07/07/2013 0955   RDW 14.3 12/05/2016 1037   LYMPHSABS 2.3 12/05/2016 1037   MONOABS 0.4 07/07/2013 0955   EOSABS 0.1 12/05/2016 1037   BASOSABS 0.0 12/05/2016 1037   Iron/TIBC/Ferritin/ %Sat No results found for: IRON, TIBC, FERRITIN, IRONPCTSAT Lipid Panel     Component Value Date/Time   CHOL 115 04/15/2017 0817   TRIG 50 04/15/2017 0817   HDL 46 04/15/2017 0817   CHOLHDL 2.5 07/07/2013 0955   VLDL 10 07/07/2013 0955   LDLCALC 59 04/15/2017 0817   Hepatic Function Panel     Component Value Date/Time   PROT 6.8 04/15/2017 0817   ALBUMIN 4.2 04/15/2017 0817   AST 26 04/15/2017 0817   ALT 53 (H) 04/15/2017 0817   ALKPHOS 71 04/15/2017 0817   BILITOT 0.4 04/15/2017 0817      Component Value Date/Time   TSH 2.240 12/05/2016 1037   TSH 5.201 (H) 07/07/2013 0955    ASSESSMENT AND PLAN: Type 2 diabetes mellitus without complication, without long-term current use of insulin (HCC) - Plan: glucose blood test strip, Lancets (ONETOUCH ULTRASOFT) lancets  Essential hypertension  At risk for heart disease  Class 2 obesity  with serious comorbidity and body mass index (BMI) of 37.0 to 37.9 in adult, unspecified obesity type  PLAN:  Diabetes II [name] has been given extensive diabetes education by myself today including ideal fasting and post-prandial blood glucose readings, individual ideal HgA1c goals  and hypoglycemia prevention. We will refill test strips and lancets. We discussed the importance of good blood sugar control to decrease the likelihood of diabetic complications such as nephropathy, neuropathy, limb loss,  blindness, coronary artery disease, and death. We discussed the importance of intensive lifestyle modification including diet, exercise and weight loss as the first line treatment for diabetes. Hommer agrees to discontinue Actos and to continue taking Metformin 100 mg BID. He agrees to follow up at the agreed upon time.  Hypertension We discussed sodium restriction, working on healthy weight loss, and a regular exercise program as the means to achieve improved blood pressure control. Margaret agreed with this plan and agreed to follow up as directed. We will continue to monitor his blood pressure as well as his progress with the above lifestyle modifications. He will continue his medications as prescribed and will watch for signs of hypotension as he continues his lifestyle modifications.  Cardiovascular risk counselling Danzel was given extended (15 minutes) coronary artery disease prevention counseling today. He is 46 y.o. male and has risk factors for heart disease including obesity. We discussed intensive lifestyle modifications today with an emphasis on specific weight loss instructions and strategies. Pt was also informed of the importance of increasing exercise and decreasing saturated fats to help prevent heart disease.  Obesity Kutler is currently in the action stage of change. As such, his goal is to continue with weight loss efforts He has agreed to follow the Category 3 plan Kolbey has been instructed to  work up to a goal of 150 minutes of combined cardio and strengthening exercise per week for weight loss and overall health benefits. We discussed the following Behavioral Modification Stratagies today: increasing lean protein intake and work on meal planning and easy cooking plans and keep healthy foods in the home.   Jacques has agreed to follow up with our clinic in 2 weeks. He was informed of the importance of frequent follow up visits to maximize his success with intensive lifestyle modifications for his multiple health conditions.    Office: [phone]  /  Fax: [phone]  OBESITY BEHAVIORAL INTERVENTION VISIT  Today's visit was # 11 out of 22.  Starting weight: 282 Starting date: 12/05/16 Today's weight : Weight: 258 lb (117 kg)  Today's date: 05/30/2017 Total lbs lost to date: 24 (Patients must lose 7 lbs in the first 6 months to continue with counseling)   ASK: We discussed the diagnosis of obesity with Alexander Sharps today and Terrius agreed to give [place] permission to discuss obesity behavioral modification therapy today.  ASSESS: Lavon has the diagnosis of obesity and his BMI today is 37.1 Jaevon is in the action stage of change   ADVISE: Kass was educated on the multiple health risks of obesity as well as the benefit of weight loss to improve his health. He was advised of the need for long term treatment and the importance of lifestyle modifications.  AGREE: Multiple dietary modification options and treatment options were discussed and  Kendon agreed to follow the Category 3 plan We discussed the following Behavioral Modification Stratagies today: increasing lean protein intake and work on meal planning and easy cooking plans and keeping healthy foods in the home.    I have reviewed the above documentation for accuracy and completeness, and I agree with the above. -Illa Level, PA-C  I have reviewed the above note and agree with the plan. -Quillian Quince, [name]

## 2017-06-04 ENCOUNTER — Encounter (INDEPENDENT_AMBULATORY_CARE_PROVIDER_SITE_OTHER): Payer: Self-pay

## 2017-06-04 ENCOUNTER — Ambulatory Visit (INDEPENDENT_AMBULATORY_CARE_PROVIDER_SITE_OTHER): Payer: 59 | Admitting: Physician Assistant

## 2017-06-07 MED FILL — FREESTYLE LANCETS: 90 days supply | Qty: 100 | Fill #0

## 2017-06-07 MED FILL — FREESTYLE LITE METER: 30 days supply | Qty: 1 | Fill #0

## 2017-06-07 MED FILL — FREESTYLE LITE TEST STRIP: 90 days supply | Qty: 100 | Fill #0

## 2017-06-11 ENCOUNTER — Ambulatory Visit (INDEPENDENT_AMBULATORY_CARE_PROVIDER_SITE_OTHER): Payer: 59 | Admitting: Physician Assistant

## 2017-06-11 VITALS — BP 120/84 | HR 99 | Temp 99.0°F | Ht 70.0 in | Wt 255.0 lb

## 2017-06-11 DIAGNOSIS — E119 Type 2 diabetes mellitus without complications: Secondary | ICD-10-CM

## 2017-06-11 DIAGNOSIS — E669 Obesity, unspecified: Secondary | ICD-10-CM

## 2017-06-11 DIAGNOSIS — IMO0001 Reserved for inherently not codable concepts without codable children: Secondary | ICD-10-CM

## 2017-06-11 DIAGNOSIS — Z6836 Body mass index (BMI) 36.0-36.9, adult: Secondary | ICD-10-CM | POA: Diagnosis not present

## 2017-06-12 NOTE — Progress Notes (Signed)
Office: [phone]  /  Fax: [phone]   HPI:   Chief Complaint: OBESITY [name] is here to discuss his progress with his obesity treatment plan. He is on the Category 3 plan and is following his eating plan approximately 75 % of the time. He states he is running and walking for 45 minutes 3 times per week. [name] continues to do well with weight loss and he plans ahead. [name] would like variety for dinner. His weight is 255 lb (115.7 kg) today and has had a weight loss of 3 pounds over a period of 2 weeks since his last visit. He has lost 27 lbs since starting treatment with us.  Diabetes II [name] has a diagnosis of diabetes type II. Mckennon states fasting BGs and post prandial range in the 90's and states that excessive fatigue is improved with decreasing metformin to 1,000 mg and he denies any hypoglycemic episodes. He has been working on intensive lifestyle modifications including diet, exercise, and weight loss to help control his blood glucose levels.  ALLERGIES: Allergies  Allergen Reactions  . Tramadol     seizures    MEDICATIONS: Current Outpatient Prescriptions on File Prior to Visit  Medication Sig Dispense Refill  . atorvastatin (LIPITOR) 80 MG tablet     . cholecalciferol (VITAMIN D) 1000 units tablet Take 1 tablet (1,000 Units total) by mouth daily.    Marland Kitchen. DILANTIN 100 MG ER capsule     . glucose blood test strip Use as instructed daily ONE TOUCH ULTRA 2 100 each 0  . Lancets (ONETOUCH ULTRASOFT) lancets Use as instructed daily 100 each 0  . Levetiracetam 750 MG TB24     . losartan (COZAAR) 25 MG tablet     . nabumetone (RELAFEN) 500 MG tablet      No current facility-administered medications on file prior to visit.     PAST MEDICAL HISTORY: Past Medical History:  Diagnosis Date  . Back pain   . Diabetes mellitus without complication (HCC)   . Hypertension   . Seizures (HCC)   . Sleep apnea    possible    PAST SURGICAL HISTORY: Past Surgical History:    Procedure Laterality Date  . arm fracture, pins inserted     1987  . BREATH TEK H PYLORI N/A 07/08/2013   Procedure: BREATH TEK H PYLORI;  Surgeon: Mariella SaaBenjamin T Hoxworth, MD;  Location: [facility];  Service: General;  Laterality: N/A;  . CIRCUMCISION    . kidney stone removal      SOCIAL HISTORY: Social History  Substance Use Topics  . Smoking status: Never Smoker  . Smokeless tobacco: Never Used  . Alcohol use No    FAMILY HISTORY: Family History  Problem Relation Age of Onset  . Cancer Mother   . Sleep apnea Brother   . Hypertension Other   . Obesity Other   . Diabetes Other   . Stroke Other   . Hyperlipidemia Maternal Grandmother   . Stroke Maternal Grandmother   . Cancer Maternal Grandmother     ROS: Review of Systems  Constitutional: Positive for malaise/fatigue and weight loss.  Endo/Heme/Allergies:       Negative hypoglycemia    PHYSICAL EXAM: Blood pressure 120/84, pulse 99, temperature 99 F (37.2 C), temperature source Oral, height 5\' 10"  (1.778 m), weight 255 lb (115.7 kg), SpO2 96 %. Body mass index is 36.59 kg/m. Physical Exam  Constitutional: He is oriented to person, place, and time. He appears well-developed and well-nourished.  Cardiovascular: Normal rate.   Pulmonary/Chest: Effort normal.  Musculoskeletal: Normal range of motion.  Neurological: He is oriented to person, place, and time.  Skin: Skin is warm and dry.  Psychiatric: He has a normal mood and affect. His behavior is normal.  Vitals reviewed.   RECENT LABS AND TESTS: BMET    Component Value Date/Time   NA 142 04/15/2017 0817   K 4.5 04/15/2017 0817   CL 104 04/15/2017 0817   CO2 23 04/15/2017 0817   GLUCOSE 85 04/15/2017 0817   GLUCOSE 90 07/07/2013 0955   BUN 12 04/15/2017 0817   CREATININE 0.50 (L) 04/15/2017 0817   CREATININE 0.87 07/07/2013 0955   CALCIUM 9.3 04/15/2017 0817   GFRNONAA 132 04/15/2017 0817   GFRAA 152 04/15/2017 0817   Lab Results  Component Value  Date   HGBA1C 5.1 04/15/2017   HGBA1C 5.5 12/05/2016   HGBA1C 5.3 07/07/2013   Lab Results  Component Value Date   INSULIN 9.5 04/15/2017   INSULIN 10.9 12/05/2016   CBC    Component Value Date/Time   WBC 7.0 12/05/2016 1037   WBC 5.7 07/07/2013 0955   RBC 5.35 12/05/2016 1037   RBC 5.35 07/07/2013 0955   HGB 15.1 12/05/2016 1037   HCT 46.3 12/05/2016 1037   PLT 236 07/07/2013 0955   MCV 87 12/05/2016 1037   MCH 28.2 12/05/2016 1037   MCH 28.0 07/07/2013 0955   MCHC 32.6 12/05/2016 1037   MCHC 34.1 07/07/2013 0955   RDW 14.3 12/05/2016 1037   LYMPHSABS 2.3 12/05/2016 1037   MONOABS 0.4 07/07/2013 0955   EOSABS 0.1 12/05/2016 1037   BASOSABS 0.0 12/05/2016 1037   Iron/TIBC/Ferritin/ %Sat No results found for: IRON, TIBC, FERRITIN, IRONPCTSAT Lipid Panel     Component Value Date/Time   CHOL 115 04/15/2017 0817   TRIG 50 04/15/2017 0817   HDL 46 04/15/2017 0817   CHOLHDL 2.5 07/07/2013 0955   VLDL 10 07/07/2013 0955   LDLCALC 59 04/15/2017 0817   Hepatic Function Panel     Component Value Date/Time   PROT 6.8 04/15/2017 0817   ALBUMIN 4.2 04/15/2017 0817   AST 26 04/15/2017 0817   ALT 53 (H) 04/15/2017 0817   ALKPHOS 71 04/15/2017 0817   BILITOT 0.4 04/15/2017 0817      Component Value Date/Time   TSH 2.240 12/05/2016 1037   TSH 5.201 (H) 07/07/2013 0955    ASSESSMENT AND PLAN: Type 2 diabetes mellitus without complication, without long-term current use of insulin (HCC) - Plan: metFORMIN (GLUCOPHAGE) 500 MG tablet  Class 2 obesity with serious comorbidity and body mass index (BMI) of 36.0 to 36.9 in adult, unspecified obesity type  PLAN:  Diabetes II Stephens has been given extensive diabetes education by myself today including ideal fasting and post-prandial blood glucose readings, individual ideal Hgb A1c goals  and hypoglycemia prevention. We discussed the importance of good blood sugar control to decrease the likelihood of diabetic complications such as  nephropathy, neuropathy, limb loss, blindness, coronary artery disease, and death. We discussed the importance of intensive lifestyle modification including diet, exercise and weight loss as the first line treatment for diabetes. Brodan agrees to continue metformin 500 mg bid and will follow up at the agreed upon time.  Obesity Ahmod is currently in the action stage of change. As such, his goal is to continue with weight loss efforts He has agreed to keep a food journal with 600 calories and 40+ grams of protein  and follow  the Category 3 plan Jaquavion has been instructed to work up to a goal of 150 minutes of combined cardio and strengthening exercise per week for weight loss and overall health benefits. We discussed the following Behavioral Modification Strategies today: keep a strict food journal and increasing lean protein intake  Aldo has agreed to follow up with our clinic in 2 weeks. He was informed of the importance of frequent follow up visits to maximize his success with intensive lifestyle modifications for his multiple health conditions.  I, [place] Crane, am acting as transcriptionist for Illa Level, PA-C  I have reviewed the above documentation for accuracy and completeness, and I agree with the above. -Illa Level, PA-C  I have reviewed the above note and agree with the plan. -Quillian Quince, MD  OBESITY BEHAVIORAL INTERVENTION VISIT  Today's visit was # 12 out of 22.  Starting weight: 282 lbs Starting date: 12/05/16 Today's weight : 255 lbs Today's date: 06/11/2017 Total lbs lost to date: 105 (Patients must lose 7 lbs in the first 6 months to continue with counseling)   ASK: We discussed the diagnosis of obesity with Phoebe Sharps today and Gerell agreed to give [place] permission to discuss obesity behavioral modification therapy today.  ASSESS: Kevork has the diagnosis of obesity and his BMI today is 36.7 Moses is in the action stage of change   ADVISE: Theophil was educated on the  multiple health risks of obesity as well as the benefit of weight loss to improve his health. He was advised of the need for long term treatment and the importance of lifestyle modifications.  AGREE: Multiple dietary modification options and treatment options were discussed and  [place] agreed to keep a food journal with 600 calories and 40+ grams of protein  and follow the Category 3 plan We discussed the following Behavioral Modification Strategies today: keep a strict food journal and increasing lean protein intake

## 2017-06-17 DIAGNOSIS — G40209 Localization-related (focal) (partial) symptomatic epilepsy and epileptic syndromes with complex partial seizures, not intractable, without status epilepticus: Secondary | ICD-10-CM | POA: Diagnosis not present

## 2017-06-19 MED ORDER — METFORMIN HCL 500 MG PO TABS: 500.0000 mg | ORAL_TABLET | Freq: Two times a day (BID) | ORAL | 0 refills | Status: AC

## 2017-06-22 DIAGNOSIS — G4733 Obstructive sleep apnea (adult) (pediatric): Secondary | ICD-10-CM | POA: Diagnosis not present

## 2017-06-25 ENCOUNTER — Ambulatory Visit (INDEPENDENT_AMBULATORY_CARE_PROVIDER_SITE_OTHER): Payer: 59 | Admitting: Physician Assistant

## 2017-06-25 ENCOUNTER — Telehealth (INDEPENDENT_AMBULATORY_CARE_PROVIDER_SITE_OTHER): Payer: Self-pay | Admitting: Physician Assistant

## 2017-06-25 DIAGNOSIS — G4733 Obstructive sleep apnea (adult) (pediatric): Secondary | ICD-10-CM | POA: Diagnosis not present

## 2017-06-25 MED FILL — LOSARTAN POTASSIUM 25 MG TA: 25 | 90 days supply | Qty: 90 | Fill #0

## 2017-06-25 NOTE — Telephone Encounter (Signed)
Patient arrived at the office requesting to speak to provider. He states he thought his 07/02/17 appointment was today. States he had a seizure at home on 06/21/17 witnessed by his wife. Today, he is alert oriented x3 and denies any current complaints. He agrees to come back on his scheduled appt on 07/02/17.

## 2017-07-02 ENCOUNTER — Ambulatory Visit (INDEPENDENT_AMBULATORY_CARE_PROVIDER_SITE_OTHER): Payer: 59 | Admitting: Physician Assistant

## 2017-07-02 VITALS — BP 120/80 | HR 88 | Temp 98.2°F | Ht 70.0 in | Wt 261.0 lb

## 2017-07-02 DIAGNOSIS — E669 Obesity, unspecified: Secondary | ICD-10-CM

## 2017-07-02 DIAGNOSIS — G40909 Epilepsy, unspecified, not intractable, without status epilepticus: Secondary | ICD-10-CM

## 2017-07-02 DIAGNOSIS — IMO0001 Reserved for inherently not codable concepts without codable children: Secondary | ICD-10-CM

## 2017-07-02 DIAGNOSIS — Z6836 Body mass index (BMI) 36.0-36.9, adult: Secondary | ICD-10-CM

## 2017-07-03 NOTE — Progress Notes (Signed)
Office: [phone]  /  Fax: [phone]   HPI:   Chief Complaint: OBESITY [name] is here to discuss his progress with his obesity treatment plan. He is on the Category 3 plan and is following his eating plan approximately 60 % of the time. He states he is exercising treadmill, pushups, and walking 30 minutes 3 times per week. [name] had a hard time following the meal plan after recent seizure episode. He has been eating out more and he is motivated about weight loss and he is ready to get back on track.  His weight is 261 lb (118.4 kg) today and has gained 6 pounds since his last visit. He has lost 21 lbs since starting treatment with us.  Seizure Disorders [name] had a recent seizure episode 2 weeks ago and he has followed up with his Neurologist and his medications were also adjusted with the Neurologist. Today he is alert and oriented x 3 and his mood is stable during today's visit with us. [name] has EEG and sleep study scheduled through his Neurologist.  ALLERGIES: Allergies  Allergen Reactions  . Tramadol     seizures    MEDICATIONS: Current Outpatient Prescriptions on File Prior to Visit  Medication Sig Dispense Refill  . atorvastatin (LIPITOR) 80 MG tablet Take 80 mg by mouth daily at 6 PM.     . cholecalciferol (VITAMIN D) 1000 units tablet Take 1 tablet (1,000 Units total) by mouth daily.    Marland Kitchen. DILANTIN 100 MG ER capsule Take 100 mg by mouth 2 (two) times daily. Pt take 2 caps in the am and 3 caps in the pm.    . glucose blood test strip Use as instructed daily ONE TOUCH ULTRA 2 100 each 0  . Lancets (ONETOUCH ULTRASOFT) lancets Use as instructed daily 100 each 0  . Levetiracetam 750 MG TB24 Take 1,500 mg by mouth 2 (two) times daily.     Marland Kitchen. losartan (COZAAR) 25 MG tablet     . metFORMIN (GLUCOPHAGE) 500 MG tablet Take 1 tablet (500 mg total) by mouth 2 (two) times daily with a meal. 60 tablet 0  . nabumetone (RELAFEN) 500 MG tablet Take 500 mg by mouth 2 (two) times daily as  needed.      No current facility-administered medications on file prior to visit.     PAST MEDICAL HISTORY: Past Medical History:  Diagnosis Date  . Back pain   . Diabetes mellitus without complication (HCC)   . Hypertension   . Seizures (HCC)   . Sleep apnea    possible    PAST SURGICAL HISTORY: Past Surgical History:  Procedure Laterality Date  . arm fracture, pins inserted     1987  . BREATH TEK H PYLORI N/A 07/08/2013   Procedure: BREATH TEK H PYLORI;  Surgeon: Mariella SaaBenjamin T Hoxworth, MD;  Location: [facility];  Service: General;  Laterality: N/A;  . CIRCUMCISION    . kidney stone removal      SOCIAL HISTORY: Social History  Substance Use Topics  . Smoking status: Never Smoker  . Smokeless tobacco: Never Used  . Alcohol use No    FAMILY HISTORY: Family History  Problem Relation Age of Onset  . Cancer Mother   . Sleep apnea Brother   . Hypertension Other   . Obesity Other   . Diabetes Other   . Stroke Other   . Hyperlipidemia Maternal Grandmother   . Stroke Maternal Grandmother   . Cancer Maternal Grandmother  ROS: Review of Systems  Constitutional: Negative for weight loss.  Neurological: Positive for seizures.    PHYSICAL EXAM: Blood pressure 120/80, pulse 88, temperature 98.2 F (36.8 C), temperature source Oral, height 5\' 10"  (1.778 m), weight 261 lb (118.4 kg), SpO2 97 %. Body mass index is 37.45 kg/m. Physical Exam  Constitutional: He is oriented to person, place, and time. He appears well-developed and well-nourished.  Cardiovascular: Normal rate.   Pulmonary/Chest: Effort normal.  Musculoskeletal: Normal range of motion.  Neurological: He is oriented to person, place, and time.  Skin: Skin is warm and dry.  Psychiatric: He has a normal mood and affect. His behavior is normal.  Vitals reviewed.   RECENT LABS AND TESTS: BMET    Component Value Date/Time   NA 142 04/15/2017 0817   K 4.5 04/15/2017 0817   CL 104 04/15/2017 0817    CO2 23 04/15/2017 0817   GLUCOSE 85 04/15/2017 0817   GLUCOSE 90 07/07/2013 0955   BUN 12 04/15/2017 0817   CREATININE 0.50 (L) 04/15/2017 0817   CREATININE 0.87 07/07/2013 0955   CALCIUM 9.3 04/15/2017 0817   GFRNONAA 132 04/15/2017 0817   GFRAA 152 04/15/2017 0817   Lab Results  Component Value Date   HGBA1C 5.1 04/15/2017   HGBA1C 5.5 12/05/2016   HGBA1C 5.3 07/07/2013   Lab Results  Component Value Date   INSULIN 9.5 04/15/2017   INSULIN 10.9 12/05/2016   CBC    Component Value Date/Time   WBC 7.0 12/05/2016 1037   WBC 5.7 07/07/2013 0955   RBC 5.35 12/05/2016 1037   RBC 5.35 07/07/2013 0955   HGB 15.1 12/05/2016 1037   HCT 46.3 12/05/2016 1037   PLT 236 07/07/2013 0955   MCV 87 12/05/2016 1037   MCH 28.2 12/05/2016 1037   MCH 28.0 07/07/2013 0955   MCHC 32.6 12/05/2016 1037   MCHC 34.1 07/07/2013 0955   RDW 14.3 12/05/2016 1037   LYMPHSABS 2.3 12/05/2016 1037   MONOABS 0.4 07/07/2013 0955   EOSABS 0.1 12/05/2016 1037   BASOSABS 0.0 12/05/2016 1037   Iron/TIBC/Ferritin/ %Sat No results found for: IRON, TIBC, FERRITIN, IRONPCTSAT Lipid Panel     Component Value Date/Time   CHOL 115 04/15/2017 0817   TRIG 50 04/15/2017 0817   HDL 46 04/15/2017 0817   CHOLHDL 2.5 07/07/2013 0955   VLDL 10 07/07/2013 0955   LDLCALC 59 04/15/2017 0817   Hepatic Function Panel     Component Value Date/Time   PROT 6.8 04/15/2017 0817   ALBUMIN 4.2 04/15/2017 0817   AST 26 04/15/2017 0817   ALT 53 (H) 04/15/2017 0817   ALKPHOS 71 04/15/2017 0817   BILITOT 0.4 04/15/2017 0817      Component Value Date/Time   TSH 2.240 12/05/2016 1037   TSH 5.201 (H) 07/07/2013 0955    ASSESSMENT AND PLAN: Seizure disorder (HCC)  Class 2 obesity with serious comorbidity and body mass index (BMI) of 36.0 to 36.9 in adult, unspecified obesity type  PLAN:  Seizure Disorders [name] agreed to continue with medications and will follow up with his Neurologist as planned. [name] agreed to  follow up with our clinic in 2 weeks. He was advised that a lower simple carbohydrate diet like we prescribed for him is thought to be helpful in decreasing seizure frequency.  We spent > than 50% of the 15 minute visit on the counseling as documented in the note.  Obesity [name] is currently in the action stage of change. As such,  his goal is to get back to weight loss efforts  He has agreed to follow the Category 3 plan [name] has been instructed to work up to a goal of 150 minutes of combined cardio and strengthening exercise per week for weight loss and overall health benefits. We discussed the following Behavioral Modification Strategies today: increasing lean protein intake and meal planning & cooking strategies   [name] has agreed to follow up with our clinic in 2 weeks. He was informed of the importance of frequent follow up visits to maximize his success with intensive lifestyle modifications for his multiple health conditions.  I, Burt Knack, am acting as transcriptionist for Illa Level, PA-C  I have reviewed the above documentation for accuracy and completeness, and I agree with the above. -Illa Level, PA-C  I have reviewed the above note and agree with the plan. -Quillian Quince, MD   OBESITY BEHAVIORAL INTERVENTION VISIT  Today's visit was # 13 out of 22.  Starting weight: 282 lbs Starting date: 12/05/16 Today's weight : 261 lbs  Today's date: 07/02/17 Total lbs lost to date: 21 (Patients must lose 7 lbs in the first 6 months to continue with counseling)   ASK: We discussed the diagnosis of obesity with [name] today and [name] agreed to give [place] permission to discuss obesity behavioral modification therapy today.  ASSESS: [name] has the diagnosis of obesity and his BMI today is 30 [name] is in the action stage of change   ADVISE: [name] was educated on the multiple health risks of obesity as well as the benefit of weight loss to improve his health. He was advised of the  need for long term treatment and the importance of lifestyle modifications.  AGREE: Multiple dietary modification options and treatment options were discussed and  [name] agreed to follow the Category 3 plan We discussed the following Behavioral Modification Strategies today: increasing lean protein intake and meal planning & cooking strategies

## 2017-07-05 MED FILL — DILANTIN 100 MG CAPSULE: 100 | 75 days supply | Qty: 450 | Fill #0

## 2017-07-15 DIAGNOSIS — G40209 Localization-related (focal) (partial) symptomatic epilepsy and epileptic syndromes with complex partial seizures, not intractable, without status epilepticus: Secondary | ICD-10-CM | POA: Diagnosis not present

## 2017-07-17 ENCOUNTER — Ambulatory Visit (INDEPENDENT_AMBULATORY_CARE_PROVIDER_SITE_OTHER): Payer: 59 | Admitting: Physician Assistant

## 2017-07-17 VITALS — BP 111/74 | HR 65 | Temp 98.4°F | Ht 70.0 in | Wt 260.0 lb

## 2017-07-17 DIAGNOSIS — E559 Vitamin D deficiency, unspecified: Secondary | ICD-10-CM | POA: Diagnosis not present

## 2017-07-17 DIAGNOSIS — E669 Obesity, unspecified: Secondary | ICD-10-CM

## 2017-07-17 DIAGNOSIS — IMO0001 Reserved for inherently not codable concepts without codable children: Secondary | ICD-10-CM

## 2017-07-17 DIAGNOSIS — Z6837 Body mass index (BMI) 37.0-37.9, adult: Secondary | ICD-10-CM | POA: Diagnosis not present

## 2017-07-17 NOTE — Progress Notes (Signed)
Office: [phone]  /  Fax: [phone]   HPI:   Chief Complaint: OBESITY [name] is here to discuss his progress with his obesity treatment plan. He is on the Category 3 plan and is following his eating plan approximately 80 % of the time. He states he is walking for 30 to 60 minutes 5 times per week. [name] continues to do well with weight loss. He is planning his meals ahead well and states hunger is well controlled.  His weight is 260 lb (117.9 kg) today and has had a weight loss of 1 pound over a period of 2 weeks since his last visit. He has lost 22 lbs since starting treatment with us.  Vitamin D deficiency [name] has a diagnosis of vitamin D deficiency. He is currently taking vit D and denies nausea, vomiting or muscle weakness.   ALLERGIES: Allergies  Allergen Reactions  . Tramadol     seizures    MEDICATIONS: Current Outpatient Prescriptions on File Prior to Visit  Medication Sig Dispense Refill  . atorvastatin (LIPITOR) 80 MG tablet Take 80 mg by mouth daily at 6 PM.     . cholecalciferol (VITAMIN D) 1000 units tablet Take 1 tablet (1,000 Units total) by mouth daily.    Marland Kitchen. DILANTIN 100 MG ER capsule Take 100 mg by mouth 2 (two) times daily. Pt take 2 caps in the am and 3 caps in the pm.    . glucose blood test strip Use as instructed daily ONE TOUCH ULTRA 2 100 each 0  . Lancets (ONETOUCH ULTRASOFT) lancets Use as instructed daily 100 each 0  . Levetiracetam 750 MG TB24 Take 1,500 mg by mouth 2 (two) times daily.     Marland Kitchen. losartan (COZAAR) 25 MG tablet     . metFORMIN (GLUCOPHAGE) 500 MG tablet Take 1 tablet (500 mg total) by mouth 2 (two) times daily with a meal. 60 tablet 0  . nabumetone (RELAFEN) 500 MG tablet Take 500 mg by mouth 2 (two) times daily as needed.      No current facility-administered medications on file prior to visit.     PAST MEDICAL HISTORY: Past Medical History:  Diagnosis Date  . Back pain   . Diabetes mellitus without complication (HCC)   .  Hypertension   . Seizures (HCC)   . Sleep apnea    possible    PAST SURGICAL HISTORY: Past Surgical History:  Procedure Laterality Date  . arm fracture, pins inserted     1987  . BREATH TEK H PYLORI N/A 07/08/2013   Procedure: BREATH TEK H PYLORI;  Surgeon: Mariella SaaBenjamin T Hoxworth, MD;  Location: [facility];  Service: General;  Laterality: N/A;  . CIRCUMCISION    . kidney stone removal      SOCIAL HISTORY: Social History  Substance Use Topics  . Smoking status: Never Smoker  . Smokeless tobacco: Never Used  . Alcohol use No    FAMILY HISTORY: Family History  Problem Relation Age of Onset  . Cancer Mother   . Sleep apnea Brother   . Hypertension Other   . Obesity Other   . Diabetes Other   . Stroke Other   . Hyperlipidemia Maternal Grandmother   . Stroke Maternal Grandmother   . Cancer Maternal Grandmother     ROS: Review of Systems  Constitutional: Positive for weight loss.  Gastrointestinal: Negative for nausea and vomiting.  Musculoskeletal:       Negative muscle weakness    PHYSICAL EXAM: Blood pressure  111/74, pulse 65, temperature 98.4 F (36.9 C), temperature source Oral, height  (1.778 m), weight 260 lb (117.9 kg), SpO2 97 %. Body mass index is 37.31 kg/m. Physical Exam  Constitutional: He is oriented to person, place, and time. He appears well-developed and well-nourished.  Cardiovascular: Normal rate.   Pulmonary/Chest: Effort normal.  Musculoskeletal: Normal range of motion.  Neurological: He is oriented to person, place, and time.  Skin: Skin is warm and dry.  Psychiatric: He has a normal mood and affect. His behavior is normal.  Vitals reviewed.   RECENT LABS AND TESTS: BMET    Component Value Date/Time   NA 142 04/15/2017 0817   K 4.5 04/15/2017 0817   CL 104 04/15/2017 0817   CO2 23 04/15/2017 0817   GLUCOSE 85 04/15/2017 0817   GLUCOSE 90 07/07/2013 0955   BUN 12 04/15/2017 0817   CREATININE 0.50 (L) 04/15/2017 0817    CREATININE 0.87 07/07/2013 0955   CALCIUM 9.3 04/15/2017 0817   GFRNONAA 132 04/15/2017 0817   GFRAA 152 04/15/2017 0817   Lab Results  Component Value Date   HGBA1C 5.1 04/15/2017   HGBA1C 5.5 12/05/2016   HGBA1C 5.3 07/07/2013   Lab Results  Component Value Date   INSULIN 9.5 04/15/2017   INSULIN 10.9 12/05/2016   CBC    Component Value Date/Time   WBC 7.0 12/05/2016 1037   WBC 5.7 07/07/2013 0955   RBC 5.35 12/05/2016 1037   RBC 5.35 07/07/2013 0955   HGB 15.1 12/05/2016 1037   HCT 46.3 12/05/2016 1037   PLT 236 07/07/2013 0955   MCV 87 12/05/2016 1037   MCH 28.2 12/05/2016 1037   MCH 28.0 07/07/2013 0955   MCHC 32.6 12/05/2016 1037   MCHC 34.1 07/07/2013 0955   RDW 14.3 12/05/2016 1037   LYMPHSABS 2.3 12/05/2016 1037   MONOABS 0.4 07/07/2013 0955   EOSABS 0.1 12/05/2016 1037   BASOSABS 0.0 12/05/2016 1037   Iron/TIBC/Ferritin/ %Sat No results found for: IRON, TIBC, FERRITIN, IRONPCTSAT Lipid Panel     Component Value Date/Time   CHOL 115 04/15/2017 0817   TRIG 50 04/15/2017 0817   HDL 46 04/15/2017 0817   CHOLHDL 2.5 07/07/2013 0955   VLDL 10 07/07/2013 0955   LDLCALC 59 04/15/2017 0817   Hepatic Function Panel     Component Value Date/Time   PROT 6.8 04/15/2017 0817   ALBUMIN 4.2 04/15/2017 0817   AST 26 04/15/2017 0817   ALT 53 (H) 04/15/2017 0817   ALKPHOS 71 04/15/2017 0817   BILITOT 0.4 04/15/2017 0817      Component Value Date/Time   TSH 2.240 12/05/2016 1037   TSH 5.201 (H) 07/07/2013 0955    ASSESSMENT AND PLAN: Vitamin D deficiency  Class 2 obesity with serious comorbidity and body mass index (BMI) of 37.0 to 37.9 in adult, unspecified obesity type  PLAN:  Vitamin D Deficiency Yuriel was informed that low vitamin D levels contributes to fatigue and are associated with obesity, breast, and colon cancer. He agrees to continue to take OTC vit D and will follow up for routine testing of vitamin D, at least 2-3 times per year. He was  informed of the risk of over-replacement of vitamin D and agrees to not increase his dose unless he discusses this with [place] first.  We spent > than 50% of the 15 minute visit on the counseling as documented in the note.   Obesity Logen is currently in the action stage of change. As  such, his goal is to continue with weight loss efforts He has agreed to follow the Category 3 plan Shan has been instructed to work up to a goal of 150 minutes of combined cardio and strengthening exercise per week for weight loss and overall health benefits. We discussed the following Behavioral Modification Strategies today: increasing lean protein intake and work on meal planning and easy cooking plans  Ahyan has agreed to follow up with our clinic in 3 weeks. He was informed of the importance of frequent follow up visits to maximize his success with intensive lifestyle modifications for his multiple health conditions.  I, [place] Crane, am acting as transcriptionist for Illa Level, PA-C  I have reviewed the above documentation for accuracy and completeness, and I agree with the above. -Illa Level, PA-C  I have reviewed the above note and agree with the plan. -Quillian Quince, MD   OBESITY BEHAVIORAL INTERVENTION VISIT  Today's visit was # 14 out of 22.  Starting weight: 282 lbs Starting date: 12/05/16 Today's weight : 260 lbs Today's date: 07/17/2017 Total lbs lost to date: 22 (Patients must lose 7 lbs in the first 6 months to continue with counseling)   ASK: We discussed the diagnosis of obesity with Phoebe Sharps today and Maijor agreed to give [place] permission to discuss obesity behavioral modification therapy today.  ASSESS: Jane has the diagnosis of obesity and his BMI today is 37.31 Markeise is in the action stage of change   ADVISE: Kingdom was educated on the multiple health risks of obesity as well as the benefit of weight loss to improve his health. He was advised of the need for long term treatment  and the importance of lifestyle modifications.  AGREE: Multiple dietary modification options and treatment options were discussed and  Daryn agreed to follow the Category 3 plan We discussed the following Behavioral Modification Strategies today: increasing lean protein intake and work on meal planning and easy cooking plans

## 2017-07-23 DIAGNOSIS — G4733 Obstructive sleep apnea (adult) (pediatric): Secondary | ICD-10-CM | POA: Diagnosis not present

## 2017-08-05 MED FILL — ATORVASTATIN 80 MG TABLET: 80 | 90 days supply | Qty: 90 | Fill #0

## 2017-08-07 DIAGNOSIS — G4733 Obstructive sleep apnea (adult) (pediatric): Secondary | ICD-10-CM | POA: Diagnosis not present

## 2017-08-08 ENCOUNTER — Ambulatory Visit (INDEPENDENT_AMBULATORY_CARE_PROVIDER_SITE_OTHER): Payer: 59 | Admitting: Physician Assistant

## 2017-08-08 VITALS — BP 122/80 | HR 70 | Temp 98.5°F | Ht 70.0 in | Wt 254.0 lb

## 2017-08-08 DIAGNOSIS — Z6836 Body mass index (BMI) 36.0-36.9, adult: Secondary | ICD-10-CM

## 2017-08-08 DIAGNOSIS — E7849 Other hyperlipidemia: Secondary | ICD-10-CM

## 2017-08-08 DIAGNOSIS — I10 Essential (primary) hypertension: Secondary | ICD-10-CM | POA: Diagnosis not present

## 2017-08-08 DIAGNOSIS — Z9189 Other specified personal risk factors, not elsewhere classified: Secondary | ICD-10-CM | POA: Diagnosis not present

## 2017-08-08 DIAGNOSIS — E119 Type 2 diabetes mellitus without complications: Secondary | ICD-10-CM | POA: Diagnosis not present

## 2017-08-08 DIAGNOSIS — E559 Vitamin D deficiency, unspecified: Secondary | ICD-10-CM | POA: Diagnosis not present

## 2017-08-08 NOTE — Progress Notes (Signed)
Office: [phone]  /  Fax: [phone]   HPI:   Chief Complaint: OBESITY [name] is here to discuss his progress with his obesity treatment plan. He is on the  follow the Category 3 plan and is following his eating plan approximately 85 % of the time. He states he is exercising 30 minutes 3 times per week. Saivion is doing well with weight loss. He has been exercising more and states hunger is well controlled. Would like more meal planning ideas. His weight is 254 lb (115.2 kg) today and has had a weight loss of 6 pounds over a period of 2 weeks since his last visit. He has lost 28 lbs since starting treatment with [place].  Hypertension [name] is a 46 y.o. male with hypertension.  [name] denies chest pain or shortness of breath on exertion. He is working weight loss to help control his blood pressure with the goal of decreasing his risk of heart attack and stroke. [name] blood pressure is currently controlled.  Hyperlipidemia [name] has hyperlipidemia and has been trying to improve his cholesterol levels with intensive lifestyle modification including a low saturated fat diet, exercise and weight loss. He denies any chest pain, claudication or myalgias.  Vitamin D deficiency [name] has a diagnosis of vitamin D deficiency. He is not currently taking vit D and denies nausea, vomiting or muscle weakness.  Diabetes II [name] has a diagnosis of diabetes type II. [name] states BGs range between 80's  and denies any hypoglycemic episodes. Last A1c was Hemoglobin A1C Latest Ref Rng & Units 04/15/2017 12/05/2016  HGBA1C 4.8 - 5.6 % 5.1 5.5  Some recent data might be hidden    He has been working on intensive lifestyle modifications including diet, exercise, and weight loss to help control his blood glucose levels.  At risk for cardiovascular disease [name] is at a higher than average risk for cardiovascular disease due to obesity. He currently denies any chest pain.   ALLERGIES: Allergies    Allergen Reactions  . Tramadol     seizures    MEDICATIONS: Current Outpatient Prescriptions on File Prior to Visit  Medication Sig Dispense Refill  . atorvastatin (LIPITOR) 80 MG tablet Take 80 mg by mouth daily at 6 PM.     . cholecalciferol (VITAMIN D) 1000 units tablet Take 1 tablet (1,000 Units total) by mouth daily.    Marland Kitchen DILANTIN 100 MG ER capsule Take 100 mg by mouth 2 (two) times daily. Pt take 2 caps in the am and 3 caps in the pm.    . glucose blood test strip Use as instructed daily ONE TOUCH ULTRA 2 100 each 0  . Lancets (ONETOUCH ULTRASOFT) lancets Use as instructed daily 100 each 0  . Levetiracetam 750 MG TB24 Take 1,500 mg by mouth 2 (two) times daily.     Marland Kitchen losartan (COZAAR) 25 MG tablet     . metFORMIN (GLUCOPHAGE) 500 MG tablet Take 1 tablet (500 mg total) by mouth 2 (two) times daily with a meal. 60 tablet 0  . nabumetone (RELAFEN) 500 MG tablet Take 500 mg by mouth 2 (two) times daily as needed.      No current facility-administered medications on file prior to visit.     PAST MEDICAL HISTORY: Past Medical History:  Diagnosis Date  . Back pain   . Diabetes mellitus without complication (HCC)   . Hypertension   . Seizures (HCC)   . Sleep apnea    possible  PAST SURGICAL HISTORY: Past Surgical History:  Procedure Laterality Date  . arm fracture, pins inserted     1987  . BREATH TEK H PYLORI N/A 07/08/2013   Procedure: BREATH TEK H PYLORI;  Surgeon: Mariella Saa, MD;  Location: [facility];  Service: General;  Laterality: N/A;  . CIRCUMCISION    . kidney stone removal      SOCIAL HISTORY: Social History  Substance Use Topics  . Smoking status: Never Smoker  . Smokeless tobacco: Never Used  . Alcohol use No    FAMILY HISTORY: Family History  Problem Relation Age of Onset  . Cancer Mother   . Sleep apnea Brother   . Hypertension Other   . Obesity Other   . Diabetes Other   . Stroke Other   . Hyperlipidemia Maternal Grandmother   .  Stroke Maternal Grandmother   . Cancer Maternal Grandmother     ROS: Review of Systems  Constitutional: Positive for weight loss.  Respiratory: Negative for shortness of breath.   Cardiovascular: Negative for chest pain and claudication.  Gastrointestinal: Negative for nausea and vomiting.  Musculoskeletal:       Negative muscle weakness.  Endo/Heme/Allergies:       Negative hypoglycemia    PHYSICAL EXAM: Blood pressure 122/80, pulse 70, temperature 98.5 F (36.9 C), height  (1.778 m), weight 254 lb (115.2 kg), SpO2 98 %. Body mass index is 36.45 kg/m. Physical Exam  Constitutional: He is oriented to person, place, and time. He appears well-developed and well-nourished.  HENT:  Head: Normocephalic and atraumatic.  Eyes: EOM are normal.  Neck: Normal range of motion.  Pulmonary/Chest: Effort normal.  Musculoskeletal: Normal range of motion.  Neurological: He is alert and oriented to person, place, and time.  Skin: Skin is warm and dry.  Psychiatric: He has a normal mood and affect.  Vitals reviewed.   RECENT LABS AND TESTS: BMET    Component Value Date/Time   NA 142 04/15/2017 0817   K 4.5 04/15/2017 0817   CL 104 04/15/2017 0817   CO2 23 04/15/2017 0817   GLUCOSE 85 04/15/2017 0817   GLUCOSE 90 07/07/2013 0955   BUN 12 04/15/2017 0817   CREATININE 0.50 (L) 04/15/2017 0817   CREATININE 0.87 07/07/2013 0955   CALCIUM 9.3 04/15/2017 0817   GFRNONAA 132 04/15/2017 0817   GFRAA 152 04/15/2017 0817   Lab Results  Component Value Date   HGBA1C 5.1 04/15/2017   HGBA1C 5.5 12/05/2016   HGBA1C 5.3 07/07/2013   Lab Results  Component Value Date   INSULIN 9.5 04/15/2017   INSULIN 10.9 12/05/2016   CBC    Component Value Date/Time   WBC 7.0 12/05/2016 1037   WBC 5.7 07/07/2013 0955   RBC 5.35 12/05/2016 1037   RBC 5.35 07/07/2013 0955   HGB 15.1 12/05/2016 1037   HCT 46.3 12/05/2016 1037   PLT 236 07/07/2013 0955   MCV 87 12/05/2016 1037   MCH 28.2  12/05/2016 1037   MCH 28.0 07/07/2013 0955   MCHC 32.6 12/05/2016 1037   MCHC 34.1 07/07/2013 0955   RDW 14.3 12/05/2016 1037   LYMPHSABS 2.3 12/05/2016 1037   MONOABS 0.4 07/07/2013 0955   EOSABS 0.1 12/05/2016 1037   BASOSABS 0.0 12/05/2016 1037   Iron/TIBC/Ferritin/ %Sat No results found for: IRON, TIBC, FERRITIN, IRONPCTSAT Lipid Panel     Component Value Date/Time   CHOL 115 04/15/2017 0817   TRIG 50 04/15/2017 0817   HDL 46 04/15/2017 0817  CHOLHDL 2.5 07/07/2013 0955   VLDL 10 07/07/2013 0955   LDLCALC 59 04/15/2017 0817   Hepatic Function Panel     Component Value Date/Time   PROT 6.8 04/15/2017 0817   ALBUMIN 4.2 04/15/2017 0817   AST 26 04/15/2017 0817   ALT 53 (H) 04/15/2017 0817   ALKPHOS 71 04/15/2017 0817   BILITOT 0.4 04/15/2017 0817      Component Value Date/Time   TSH 2.240 12/05/2016 1037   TSH 5.201 (H) 07/07/2013 0955    ASSESSMENT AND PLAN: Other hyperlipidemia - Plan: Lipid Panel With LDL/HDL Ratio  Vitamin D deficiency - Plan: VITAMIN D 25 Hydroxy (Vit-D Deficiency, Fractures)  Essential hypertension  At risk for heart disease  Type 2 diabetes mellitus without complication, without long-term current use of insulin (HCC) - Plan: Comprehensive metabolic panel, Hemoglobin A1c, Insulin, random  Class 2 severe obesity with serious comorbidity and body mass index (BMI) of 36.0 to 36.9 in adult, unspecified obesity type (HCC)  PLAN: Hypertension We discussed sodium restriction, working on healthy weight loss, and a regular exercise program as the means to achieve improved blood pressure control. [name] agreed with this plan and agreed to follow up as directed. We will continue to monitor his blood pressure as well as his progress with the above lifestyle modifications. He will continue his medications as prescribed and will watch for signs of hypotension as he continues his lifestyle modifications.   Hyperlipidemia [name] was informed of the  American Heart Association Guidelines emphasizing intensive lifestyle modifications as the first line treatment for hyperlipidemia. We discussed many lifestyle modifications today in depth, and [name] will continue to work on decreasing saturated fats such as fatty red meat, butter and many fried foods. He will also increase vegetables and lean protein in his diet and continue to work on exercise and weight loss efforts.  Vitamin D Deficiency [name] was informed that low vitamin D levels contributes to fatigue and are associated with obesity, breast, and colon cancer. He agrees to continue to take prescription Vit D ,000 IU every week and will follow up for routine testing of vitamin D, at least 2-3 times per year. He was informed of the risk of over-replacement of vitamin D and agrees to not increase his dose unless he discusses this with [place] first. Not yet at goal. Will follow up.   Diabetes II [name] has been given extensive diabetes education by myself today including ideal fasting and post-prandial blood glucose readings, individual ideal HgA1c goals  and hypoglycemia prevention. We discussed the importance of good blood sugar control to decrease the likelihood of diabetic complications such as nephropathy, neuropathy, limb loss, blindness, coronary artery disease, and death. We discussed the importance of intensive lifestyle modification including diet, exercise and weight loss as the first line treatment for diabetes. [name] agrees to continue his diabetes medications and will follow up at the agreed upon time.   Cardiovascular risk counselling [name] was given extended (15 minutes) coronary artery disease prevention counseling today. He is 46 y.o. male and has risk factors for heart disease including obesity. We discussed intensive lifestyle modifications today with an emphasis on specific weight loss instructions and strategies. Pt was also informed of the importance of increasing exercise and decreasing  saturated fats to help prevent heart disease.  Obesity [name] is currently in the action stage of change. As such, his goal is to continue with weight loss efforts He has agreed to follow the Category 3 plan, journaling 600 calories and  40 protein for dinner. [name] has been instructed to work up to a goal of 150 minutes of combined cardio and strengthening exercise per week for weight loss and overall health benefits. We discussed the following Behavioral Modification Stratagies today: increasing lean protein intake and work on meal planning and easy cooking plans  [name] has agreed to follow up with our clinic in 3 weeks. He was informed of the importance of frequent follow up visits to maximize his success with intensive lifestyle modifications for his multiple health conditions.  I, April Moore, am acting as Energy manager for [employer], PA-C  I have reviewed the above documentation for accuracy and completeness, and I agree with the above. -Illa Level, PA-C  I have reviewed the above note and agree with the plan. -Quillian Quince, MD

## 2017-08-09 LAB — VITAMIN D 25 HYDROXY (VIT D DEFICIENCY, FRACTURES): VIT D 25 HYDROXY: 55.2 ng/mL (ref 30.0–100.0)

## 2017-08-09 LAB — COMPREHENSIVE METABOLIC PANEL
A/G RATIO: 1.8 (ref 1.2–2.2)
ALK PHOS: 67 IU/L (ref 39–117)
ALT: 40 IU/L (ref 0–44)
AST: 18 IU/L (ref 0–40)
Albumin: 4.5 g/dL (ref 3.5–5.5)
BUN/Creatinine Ratio: 17 (ref 9–20)
BUN: 11 mg/dL (ref 6–24)
Bilirubin Total: 0.4 mg/dL (ref 0.0–1.2)
CO2: 26 mmol/L (ref 20–29)
Calcium: 9.6 mg/dL (ref 8.7–10.2)
Chloride: 105 mmol/L (ref 96–106)
Creatinine, Ser: 0.65 mg/dL — ABNORMAL LOW (ref 0.76–1.27)
GFR calc Af Amer: 137 mL/min/{1.73_m2} (ref >59)
GFR calc non Af Amer: 118 mL/min/{1.73_m2} (ref >59)
GLOBULIN, TOTAL: 2.5 g/dL (ref 1.5–4.5)
Glucose: 94 mg/dL (ref 65–99)
POTASSIUM: 4.7 mmol/L (ref 3.5–5.2)
SODIUM: 145 mmol/L — AB (ref 134–144)
Total Protein: 7 g/dL (ref 6.0–8.5)

## 2017-08-09 LAB — LIPID PANEL WITH LDL/HDL RATIO
Cholesterol, Total: 120 mg/dL (ref 100–199)
HDL: 48 mg/dL (ref >39)
LDL CALC: 60 mg/dL (ref 0–99)
LDl/HDL Ratio: 1.3 ratio (ref 0.0–3.6)
Triglycerides: 62 mg/dL (ref 0–149)
VLDL Cholesterol Cal: 12 mg/dL (ref 5–40)

## 2017-08-09 LAB — HEMOGLOBIN A1C
ESTIMATED AVERAGE GLUCOSE: 105 mg/dL
HEMOGLOBIN A1C: 5.3 % (ref 4.8–5.6)

## 2017-08-09 LAB — INSULIN, RANDOM: INSULIN: 11.2 u[IU]/mL (ref 2.6–24.9)

## 2017-08-19 DIAGNOSIS — G40209 Localization-related (focal) (partial) symptomatic epilepsy and epileptic syndromes with complex partial seizures, not intractable, without status epilepticus: Secondary | ICD-10-CM | POA: Diagnosis not present

## 2017-08-22 ENCOUNTER — Ambulatory Visit (INDEPENDENT_AMBULATORY_CARE_PROVIDER_SITE_OTHER): Payer: 59 | Admitting: Physician Assistant

## 2017-08-22 VITALS — BP 117/73 | HR 77 | Temp 97.9°F | Ht 70.0 in | Wt 260.0 lb

## 2017-08-22 DIAGNOSIS — I10 Essential (primary) hypertension: Secondary | ICD-10-CM

## 2017-08-22 DIAGNOSIS — Z6837 Body mass index (BMI) 37.0-37.9, adult: Secondary | ICD-10-CM

## 2017-08-22 DIAGNOSIS — G4733 Obstructive sleep apnea (adult) (pediatric): Secondary | ICD-10-CM | POA: Diagnosis not present

## 2017-08-22 NOTE — Progress Notes (Signed)
Office: [phone]  /  Fax: [phone]   HPI:   Chief Complaint: OBESITY [name] is here to discuss his progress with his obesity treatment plan. He is on the Category 3 plan and is following his eating plan approximately 20 % of the time. He states he is walking at work for 60 minutes 5 times per week. [name] has been working double shifts and has been busy. He has not planned his meals well and has had to eat out more. His weight is 260 lb (117.9 kg) today and has had a weight gain of 6 pounds over a period of 2 weeks since his last visit. He has lost 22 lbs since starting treatment with [name].  Hypertension [name] is a 46 y.o. male with hypertension. [name] denies chest pain or shortness of breath on exertion. He is working weight loss to help control his blood pressure with the goal of decreasing his risk of heart attack and stroke. [name] blood pressure is currently stable.   ALLERGIES: Allergies  Allergen Reactions  . Tramadol     seizures    MEDICATIONS: Current Outpatient Prescriptions on File Prior to Visit  Medication Sig Dispense Refill  . atorvastatin (LIPITOR) 80 MG tablet Take 80 mg by mouth daily at 6 PM.     . cholecalciferol (VITAMIN D) 1000 units tablet Take 1 tablet (1,000 Units total) by mouth daily.    Marland Kitchen DILANTIN 100 MG ER capsule Take 100 mg by mouth 2 (two) times daily. Pt take 2 caps in the am and 3 caps in the pm.    . glucose blood test strip Use as instructed daily ONE TOUCH ULTRA 2 100 each 0  . Lancets (ONETOUCH ULTRASOFT) lancets Use as instructed daily 100 each 0  . Levetiracetam 750 MG TB24 Take 1,500 mg by mouth 2 (two) times daily.     Marland Kitchen losartan (COZAAR) 25 MG tablet     . metFORMIN (GLUCOPHAGE) 500 MG tablet Take 1 tablet (500 mg total) by mouth 2 (two) times daily with a meal. 60 tablet 0  . nabumetone (RELAFEN) 500 MG tablet Take 500 mg by mouth 2 (two) times daily as needed.      No current facility-administered medications on file  prior to visit.     PAST MEDICAL HISTORY: Past Medical History:  Diagnosis Date  . Back pain   . Diabetes mellitus without complication (HCC)   . Hypertension   . Seizures (HCC)   . Sleep apnea    possible    PAST SURGICAL HISTORY: Past Surgical History:  Procedure Laterality Date  . arm fracture, pins inserted     1987  . BREATH TEK H PYLORI N/A 07/08/2013   Procedure: BREATH TEK H PYLORI;  Surgeon: Mariella Saa, MD;  Location: [facility];  Service: General;  Laterality: N/A;  . CIRCUMCISION    . kidney stone removal      SOCIAL HISTORY: Social History  Substance Use Topics  . Smoking status: Never Smoker  . Smokeless tobacco: Never Used  . Alcohol use No    FAMILY HISTORY: Family History  Problem Relation Age of Onset  . Cancer Mother   . Sleep apnea Brother   . Hypertension Other   . Obesity Other   . Diabetes Other   . Stroke Other   . Hyperlipidemia Maternal Grandmother   . Stroke Maternal Grandmother   . Cancer Maternal Grandmother     ROS: Review of Systems  Constitutional: Negative for weight loss.  Respiratory: Negative for shortness of breath (on exertion).   Cardiovascular: Negative for chest pain.    PHYSICAL EXAM: Blood pressure 117/73, pulse 77, temperature 97.9 F (36.6 C), temperature source Oral, height 5\' 10"  (1.778 m), weight 260 lb (117.9 kg), SpO2 97 %. Body mass index is 37.31 kg/m. Physical Exam  Constitutional: He is oriented to person, place, and time. He appears well-developed and well-nourished.  Cardiovascular: Normal rate.   Pulmonary/Chest: Effort normal.  Musculoskeletal: Normal range of motion.  Neurological: He is oriented to person, place, and time.  Skin: Skin is warm and dry.  Psychiatric: He has a normal mood and affect.  Vitals reviewed.   RECENT LABS AND TESTS: BMET    Component Value Date/Time   NA 145 (H) 08/08/2017 0922   K 4.7 08/08/2017 0922   CL 105 08/08/2017 0922   CO2 26 08/08/2017 0922    GLUCOSE 94 08/08/2017 0922   GLUCOSE 90 07/07/2013 0955   BUN 11 08/08/2017 0922   CREATININE 0.65 (L) 08/08/2017 0922   CREATININE 0.87 07/07/2013 0955   CALCIUM 9.6 08/08/2017 0922   GFRNONAA 118 08/08/2017 0922   GFRAA 137 08/08/2017 0922   Lab Results  Component Value Date   HGBA1C 5.3 08/08/2017   HGBA1C 5.1 04/15/2017   HGBA1C 5.5 12/05/2016   HGBA1C 5.3 07/07/2013   Lab Results  Component Value Date   INSULIN 11.2 08/08/2017   INSULIN 9.5 04/15/2017   INSULIN 10.9 12/05/2016   CBC    Component Value Date/Time   WBC 7.0 12/05/2016 1037   WBC 5.7 07/07/2013 0955   RBC 5.35 12/05/2016 1037   RBC 5.35 07/07/2013 0955   HGB 15.1 12/05/2016 1037   HCT 46.3 12/05/2016 1037   PLT 236 07/07/2013 0955   MCV 87 12/05/2016 1037   MCH 28.2 12/05/2016 1037   MCH 28.0 07/07/2013 0955   MCHC 32.6 12/05/2016 1037   MCHC 34.1 07/07/2013 0955   RDW 14.3 12/05/2016 1037   LYMPHSABS 2.3 12/05/2016 1037   MONOABS 0.4 07/07/2013 0955   EOSABS 0.1 12/05/2016 1037   BASOSABS 0.0 12/05/2016 1037   Iron/TIBC/Ferritin/ %Sat No results found for: IRON, TIBC, FERRITIN, IRONPCTSAT Lipid Panel     Component Value Date/Time   CHOL 120 08/08/2017 0922   TRIG 62 08/08/2017 0922   HDL 48 08/08/2017 0922   CHOLHDL 2.5 07/07/2013 0955   VLDL 10 07/07/2013 0955   LDLCALC 60 08/08/2017 0922   Hepatic Function Panel     Component Value Date/Time   PROT 7.0 08/08/2017 0922   ALBUMIN 4.5 08/08/2017 0922   AST 18 08/08/2017 0922   ALT 40 08/08/2017 0922   ALKPHOS 67 08/08/2017 0922   BILITOT 0.4 08/08/2017 0922      Component Value Date/Time   TSH 2.240 12/05/2016 1037   TSH 5.201 (H) 07/07/2013 0955    ASSESSMENT AND PLAN: Essential hypertension  Class 2 severe obesity with serious comorbidity and body mass index (BMI) of 37.0 to 37.9 in adult, unspecified obesity type (HCC)  PLAN:  Hypertension We discussed sodium restriction, working on healthy weight loss, and a  regular exercise program as the means to achieve improved blood pressure control. [name] agreed with this plan and agreed to follow up as directed. We will continue to monitor his blood pressure as well as his progress with the above lifestyle modifications. He will continue his medications as prescribed and will watch for signs of hypotension as he  continues his lifestyle modifications.  We spent > than 50% of the 15 minute visit on the counseling as documented in the note.  Obesity [name] is currently in the action stage of change. As such, his goal is to continue with weight loss efforts He has agreed to follow the Category 3 plan [name] has been instructed to work up to a goal of 150 minutes of combined cardio and strengthening exercise per week for weight loss and overall health benefits. We discussed the following Behavioral Modification Strategies today: planning for success, increasing lean protein intake and decrease eating out  [name] has agreed to follow up with our clinic in 2 weeks. He was informed of the importance of frequent follow up visits to maximize his success with intensive lifestyle modifications for his multiple health conditions.  I, [place] CraneJoanne Murray, am acting as transcriptionist for Illa LevelSahar Osman, PA-C  I have reviewed the above documentation for accuracy and completeness, and I agree with the above. -Illa LevelSahar Osman, PA-C  I have reviewed the above note and agree with the plan. -Quillian Quincearen Nashonda Limberg, MD   OBESITY BEHAVIORAL INTERVENTION VISIT  Today's visit was # 16 out of 22.  Starting weight: 282 lbs Starting date: 12/05/16 Today's weight : 260 lbs Today's date: 08/22/2017 Total lbs lost to date: 22 (Patients must lose 7 lbs in the first 6 months to continue with counseling)   ASK: We discussed the diagnosis of obesity with Phoebe SharpsEric A Brandes today and [name] agreed to give us permission to discuss obesity behavioral modification therapy today.  ASSESS: [name] has the diagnosis of  obesity and his BMI today is 37.31 [name] is in the action stage of change   ADVISE: [name] was educated on the multiple health risks of obesity as well as the benefit of weight loss to improve his health. He was advised of the need for long term treatment and the importance of lifestyle modifications.  AGREE: Multiple dietary modification options and treatment options were discussed and  [name] agreed to follow the Category 3 plan We discussed the following Behavioral Modification Strategies today: planning for success, increasing lean protein intake and decrease eating out

## 2017-09-04 ENCOUNTER — Ambulatory Visit (INDEPENDENT_AMBULATORY_CARE_PROVIDER_SITE_OTHER): Payer: 59 | Admitting: Physician Assistant

## 2017-09-04 VITALS — BP 117/76 | HR 67 | Temp 98.2°F | Ht 70.0 in | Wt 262.0 lb

## 2017-09-04 DIAGNOSIS — E7849 Other hyperlipidemia: Secondary | ICD-10-CM | POA: Diagnosis not present

## 2017-09-04 DIAGNOSIS — Z6837 Body mass index (BMI) 37.0-37.9, adult: Secondary | ICD-10-CM

## 2017-09-04 DIAGNOSIS — G4709 Other insomnia: Secondary | ICD-10-CM

## 2017-09-04 DIAGNOSIS — Z9189 Other specified personal risk factors, not elsewhere classified: Secondary | ICD-10-CM

## 2017-09-04 MED ORDER — MELATONIN 10 MG PO TABS: 1.0000 | ORAL_TABLET | Freq: Every evening | ORAL | 0 refills | Status: AC | PRN

## 2017-09-04 NOTE — Progress Notes (Signed)
Office: [phone]  /  Fax: [phone]   HPI:   Chief Complaint: OBESITY [name] is here to discuss his progress with his obesity treatment plan. He is on the Category 3 plan and is following his eating plan approximately 60 % of the time. He states he is walking for 30 minutes 4 times per week. [name] continues to struggle with planning his meals ahead with his busy work schedule. He would like more convenience foods and meal planning ideas. His weight is 262 lb (118.8 kg) today and has had a weight gain of 2 pounds over a period of 2 weeks since his last visit. He has lost 20 lbs since starting treatment with [place].  Insomnia [name] has a history of obstructive sleep apnea and is on CPAP. States he uses his CPAP nightly. He has been working longer hours and gets home tired and states he is not sleeping as well.   Hyperlipidemia [name] has hyperlipidemia and is currently on Lipitor. He has been trying to improve his cholesterol levels with intensive lifestyle modification including a low saturated fat diet, exercise and weight loss. He denies any chest pain, claudication or myalgias.  At risk for cardiovascular disease [name] is at a higher than average risk for cardiovascular disease due to obesity and hyperlipidemia. He currently denies any chest pain.  ALLERGIES: Allergies  Allergen Reactions  . Tramadol     seizures    MEDICATIONS: Current Outpatient Prescriptions on File Prior to Visit  Medication Sig Dispense Refill  . atorvastatin (LIPITOR) 80 MG tablet Take 80 mg by mouth daily at 6 PM.     . cholecalciferol (VITAMIN D) 1000 units tablet Take 1 tablet (1,000 Units total) by mouth daily.    Marland Kitchen DILANTIN 100 MG ER capsule Take 100 mg by mouth 2 (two) times daily. Pt take 2 caps in the am and 3 caps in the pm.    . glucose blood test strip Use as instructed daily ONE TOUCH ULTRA 2 100 each 0  . Lancets (ONETOUCH ULTRASOFT) lancets Use as instructed daily 100 each 0  . Levetiracetam  750 MG TB24 Take 1,500 mg by mouth 2 (two) times daily.     Marland Kitchen losartan (COZAAR) 25 MG tablet     . metFORMIN (GLUCOPHAGE) 500 MG tablet Take 1 tablet (500 mg total) by mouth 2 (two) times daily with a meal. 60 tablet 0  . nabumetone (RELAFEN) 500 MG tablet Take 500 mg by mouth 2 (two) times daily as needed.      No current facility-administered medications on file prior to visit.     PAST MEDICAL HISTORY: Past Medical History:  Diagnosis Date  . Back pain   . Diabetes mellitus without complication (HCC)   . Hypertension   . Seizures (HCC)   . Sleep apnea    possible    PAST SURGICAL HISTORY: Past Surgical History:  Procedure Laterality Date  . arm fracture, pins inserted     1987  . BREATH TEK H PYLORI N/A 07/08/2013   Procedure: BREATH TEK H PYLORI;  Surgeon: Mariella Saa, MD;  Location: [facility];  Service: General;  Laterality: N/A;  . CIRCUMCISION    . kidney stone removal      SOCIAL HISTORY: Social History  Substance Use Topics  . Smoking status: Never Smoker  . Smokeless tobacco: Never Used  . Alcohol use No    FAMILY HISTORY: Family History  Problem Relation Age of Onset  . Cancer Mother   .  Sleep apnea Brother   . Hypertension Other   . Obesity Other   . Diabetes Other   . Stroke Other   . Hyperlipidemia Maternal Grandmother   . Stroke Maternal Grandmother   . Cancer Maternal Grandmother     ROS: Review of Systems  Constitutional: Negative for weight loss.  Cardiovascular: Negative for chest pain and claudication.  Musculoskeletal: Negative for myalgias.  Psychiatric/Behavioral: The patient has insomnia.     PHYSICAL EXAM: Blood pressure 117/76, pulse 67, temperature 98.2 F (36.8 C), temperature source Oral, height 5\' 10"  (1.778 m), weight 262 lb (118.8 kg), SpO2 98 %. Body mass index is 37.59 kg/m. Physical Exam  Constitutional: He is oriented to person, place, and time. He appears well-developed and well-nourished.    Cardiovascular: Normal rate.   Pulmonary/Chest: Effort normal.  Musculoskeletal: Normal range of motion.  Neurological: He is oriented to person, place, and time.  Skin: Skin is warm and dry.  Psychiatric: He has a normal mood and affect. His behavior is normal.  Vitals reviewed.   RECENT LABS AND TESTS: BMET    Component Value Date/Time   NA 145 (H) 08/08/2017 0922   K 4.7 08/08/2017 0922   CL 105 08/08/2017 0922   CO2 26 08/08/2017 0922   GLUCOSE 94 08/08/2017 0922   GLUCOSE 90 07/07/2013 0955   BUN 11 08/08/2017 0922   CREATININE 0.65 (L) 08/08/2017 0922   CREATININE 0.87 07/07/2013 0955   CALCIUM 9.6 08/08/2017 0922   GFRNONAA 118 08/08/2017 0922   GFRAA 137 08/08/2017 0922   Lab Results  Component Value Date   HGBA1C 5.3 08/08/2017   HGBA1C 5.1 04/15/2017   HGBA1C 5.5 12/05/2016   HGBA1C 5.3 07/07/2013   Lab Results  Component Value Date   INSULIN 11.2 08/08/2017   INSULIN 9.5 04/15/2017   INSULIN 10.9 12/05/2016   CBC    Component Value Date/Time   WBC 7.0 12/05/2016 1037   WBC 5.7 07/07/2013 0955   RBC 5.35 12/05/2016 1037   RBC 5.35 07/07/2013 0955   HGB 15.1 12/05/2016 1037   HCT 46.3 12/05/2016 1037   PLT 236 07/07/2013 0955   MCV 87 12/05/2016 1037   MCH 28.2 12/05/2016 1037   MCH 28.0 07/07/2013 0955   MCHC 32.6 12/05/2016 1037   MCHC 34.1 07/07/2013 0955   RDW 14.3 12/05/2016 1037   LYMPHSABS 2.3 12/05/2016 1037   MONOABS 0.4 07/07/2013 0955   EOSABS 0.1 12/05/2016 1037   BASOSABS 0.0 12/05/2016 1037   Iron/TIBC/Ferritin/ %Sat No results found for: IRON, TIBC, FERRITIN, IRONPCTSAT Lipid Panel     Component Value Date/Time   CHOL 120 08/08/2017 0922   TRIG 62 08/08/2017 0922   HDL 48 08/08/2017 0922   CHOLHDL 2.5 07/07/2013 0955   VLDL 10 07/07/2013 0955   LDLCALC 60 08/08/2017 0922   Hepatic Function Panel     Component Value Date/Time   PROT 7.0 08/08/2017 0922   ALBUMIN 4.5 08/08/2017 0922   AST 18 08/08/2017 0922   ALT 40  08/08/2017 0922   ALKPHOS 67 08/08/2017 0922   BILITOT 0.4 08/08/2017 0922      Component Value Date/Time   TSH 2.240 12/05/2016 1037   TSH 5.201 (H) 07/07/2013 0955    ASSESSMENT AND PLAN: Other insomnia - Plan: Melatonin 10 MG TABS  Other hyperlipidemia  At risk for heart disease  Class 2 severe obesity with serious comorbidity and body mass index (BMI) of 37.0 to 37.9 in adult, unspecified obesity type (  HCC)  PLAN:  Insomnia [name] is educated about the importance of using his CPAP machine nightly. He is informed of proper sleep hygiene.  [name] agrees to start Melatonin 10 mg and will follow up with our clinic in 2 weeks.  Hyperlipidemia [name] was informed of the American Heart Association Guidelines emphasizing intensive lifestyle modifications as the first line treatment for hyperlipidemia. We discussed many lifestyle modifications today in depth, and [name] will continue to work on decreasing saturated fats such as fatty red meat, butter and many fried foods. He will also increase vegetables and lean protein in his diet and continue to work on exercise and weight loss efforts. [name] will continue his medications as prescribed and will follow up with our clinic in 2 weeks.  Cardiovascular risk counseling [name] was given extended (15 minutes) coronary artery disease prevention counseling today. He is 46 y.o. male and has risk factors for heart disease including obesity and hyperlipidemia. We discussed intensive lifestyle modifications today with an emphasis on specific weight loss instructions and strategies. Pt was also informed of the importance of increasing exercise and decreasing saturated fats to help prevent heart disease.  Obesity [name] is currently in the action stage of change. As such, his goal is to continue with weight loss efforts He has agreed to keep a food journal with 1500 to 1700 calories and 100 grams of protein daily [name] has been instructed to work up to a goal of  150 minutes of combined cardio and strengthening exercise per week for weight loss and overall health benefits. We discussed the following Behavioral Modification Strategies today: increasing lean protein intake and keep a strict food journal  [name] has agreed to follow up with our clinic in 2 weeks. He was informed of the importance of frequent follow up visits to maximize his success with intensive lifestyle modifications for his multiple health conditions.  I, [place] CraneJoanne Murray, am acting as transcriptionist for Illa LevelSahar Osman, PA-C  I have reviewed the above documentation for accuracy and completeness, and I agree with the above. -Illa LevelSahar Osman, PA-C  I have reviewed the above note and agree with the plan. -Quillian Quincearen Beasley, MD  OBESITY BEHAVIORAL INTERVENTION VISIT  Today's visit was # 17 out of 22.  Starting weight: 282 lbs Starting date: 12/05/16 Today's weight : 262 lbs Today's date: 09/04/2017 Total lbs lost to date: 20 (Patients must lose 7 lbs in the first 6 months to continue with counseling)   ASK: We discussed the diagnosis of obesity with [name] today and [name] agreed to give us permission to discuss obesity behavioral modification therapy today.  ASSESS: [name] has the diagnosis of obesity and his BMI today is 37.59 [name] is in the action stage of change   ADVISE: [name] was educated on the multiple health risks of obesity as well as the benefit of weight loss to improve his health. He was advised of the need for long term treatment and the importance of lifestyle modifications.  AGREE: Multiple dietary modification options and treatment options were discussed and  [name] agreed to keep a food journal with 1500 to 1700 calories and 100 grams of protein daily We discussed the following Behavioral Modification Strategies today: increasing lean protein intake and keep a strict food journal

## 2017-09-19 ENCOUNTER — Ambulatory Visit (INDEPENDENT_AMBULATORY_CARE_PROVIDER_SITE_OTHER): Payer: 59 | Admitting: Physician Assistant

## 2017-09-19 ENCOUNTER — Encounter (INDEPENDENT_AMBULATORY_CARE_PROVIDER_SITE_OTHER): Payer: Self-pay

## 2017-09-30 MED FILL — metFORMIN HCL 1000 MG TABS: 1000 | 90 days supply | Qty: 180 | Fill #0

## 2017-10-01 MED FILL — DILANTIN 100 MG CAPSULE: 100 | 30 days supply | Qty: 150 | Fill #0

## 2017-10-14 MED FILL — LOSARTAN POTASSIUM 25 MG TA: 25 | 90 days supply | Qty: 90 | Fill #1

## 2017-10-21 DIAGNOSIS — G40209 Localization-related (focal) (partial) symptomatic epilepsy and epileptic syndromes with complex partial seizures, not intractable, without status epilepticus: Secondary | ICD-10-CM | POA: Diagnosis not present

## 2017-10-28 MED FILL — ATORVASTATIN 80 MG TABLET: 80 | 90 days supply | Qty: 90 | Fill #1

## 2017-10-28 MED FILL — DILANTIN 100 MG CAPSULE: 100 | 30 days supply | Qty: 150 | Fill #0

## 2017-12-02 MED FILL — DILANTIN 100 MG CAPSULE: 100 | 30 days supply | Qty: 150 | Fill #1

## 2018-01-01 MED FILL — LOSARTAN POTASSIUM 25 MG TA: 25 | 90 days supply | Qty: 90 | Fill #2

## 2018-01-01 MED FILL — DILANTIN 100 MG CAPSULE: 100 | 30 days supply | Qty: 150 | Fill #2

## 2018-01-08 MED FILL — metFORMIN HCL 1000 MG TABS: 1000 | 90 days supply | Qty: 180 | Fill #1

## 2018-01-12 DIAGNOSIS — J111 Influenza due to unidentified influenza virus with other respiratory manifestations: Secondary | ICD-10-CM | POA: Diagnosis not present

## 2018-01-12 DIAGNOSIS — H6691 Otitis media, unspecified, right ear: Secondary | ICD-10-CM | POA: Diagnosis not present

## 2018-01-24 MED FILL — ATORVASTATIN 80 MG TABLET: 80 | 90 days supply | Qty: 90 | Fill #2

## 2018-01-30 MED FILL — DILANTIN 100 MG CAPSULE: 100 | 30 days supply | Qty: 150 | Fill #3

## 2018-02-27 MED FILL — DILANTIN 100 MG CAPSULE: 100 | 30 days supply | Qty: 150 | Fill #4

## 2018-04-01 MED FILL — LOSARTAN POTASSIUM 25 MG TA: 25 | 90 days supply | Qty: 90 | Fill #0

## 2018-04-01 MED FILL — DILANTIN 100 MG CAPSULE: 100 | 30 days supply | Qty: 150 | Fill #5

## 2018-04-07 MED FILL — metFORMIN HCL 1000 MG TABS: 1000 | 90 days supply | Qty: 180 | Fill #2

## 2018-04-21 DIAGNOSIS — G40209 Localization-related (focal) (partial) symptomatic epilepsy and epileptic syndromes with complex partial seizures, not intractable, without status epilepticus: Secondary | ICD-10-CM | POA: Diagnosis not present

## 2018-04-24 MED FILL — ATORVASTATIN 80 MG TABLET: 80 | 90 days supply | Qty: 90 | Fill #3

## 2018-04-25 MED FILL — DILANTIN 100 MG CAPSULE: 100 | 90 days supply | Qty: 540 | Fill #0 | Status: TO
